# Patient Record
Sex: Male | Born: 1970 | Race: White | Hispanic: No | Smoking: Never smoker
Health system: Southern US, Community
[De-identification: ages and names within clinical notes are randomized; demographics above are authoritative.]

## PROBLEM LIST (undated history)

## (undated) DIAGNOSIS — I10 Essential (primary) hypertension: Secondary | ICD-10-CM

## (undated) DIAGNOSIS — L719 Rosacea, unspecified: Secondary | ICD-10-CM

## (undated) DIAGNOSIS — K219 Gastro-esophageal reflux disease without esophagitis: Secondary | ICD-10-CM

## (undated) DIAGNOSIS — G4730 Sleep apnea, unspecified: Secondary | ICD-10-CM

## (undated) HISTORY — PX: ADENOIDECTOMY: SUR15

## (undated) HISTORY — DX: Essential (primary) hypertension: I10

---

## 2009-05-09 ENCOUNTER — Ambulatory Visit: Payer: Self-pay | Admitting: Family Medicine

## 2013-03-21 DIAGNOSIS — R1013 Epigastric pain: Secondary | ICD-10-CM | POA: Insufficient documentation

## 2013-09-19 DIAGNOSIS — K2270 Barrett's esophagus without dysplasia: Secondary | ICD-10-CM | POA: Insufficient documentation

## 2014-09-23 ENCOUNTER — Ambulatory Visit: Payer: Self-pay | Admitting: Gastroenterology

## 2014-10-13 ENCOUNTER — Ambulatory Visit: Payer: Self-pay | Admitting: Gastroenterology

## 2015-04-10 ENCOUNTER — Ambulatory Visit
Admission: EM | Admit: 2015-04-10 | Discharge: 2015-04-10 | Disposition: A | Discharge status: Home / Self Care | Payer: BLUE CROSS/BLUE SHIELD | Attending: Internal Medicine | Admitting: Internal Medicine

## 2015-04-10 ENCOUNTER — Other Ambulatory Visit: Payer: Self-pay

## 2015-04-10 ENCOUNTER — Encounter: Payer: Self-pay | Admitting: Emergency Medicine

## 2015-04-10 DIAGNOSIS — R5383 Other fatigue: Secondary | ICD-10-CM | POA: Diagnosis present

## 2015-04-10 DIAGNOSIS — K219 Gastro-esophageal reflux disease without esophagitis: Secondary | ICD-10-CM | POA: Insufficient documentation

## 2015-04-10 DIAGNOSIS — R0789 Other chest pain: Secondary | ICD-10-CM

## 2015-04-10 DIAGNOSIS — K2270 Barrett's esophagus without dysplasia: Secondary | ICD-10-CM | POA: Insufficient documentation

## 2015-04-10 DIAGNOSIS — Z79899 Other long term (current) drug therapy: Secondary | ICD-10-CM | POA: Insufficient documentation

## 2015-04-10 DIAGNOSIS — L719 Rosacea, unspecified: Secondary | ICD-10-CM | POA: Insufficient documentation

## 2015-04-10 DIAGNOSIS — R531 Weakness: Secondary | ICD-10-CM | POA: Diagnosis not present

## 2015-04-10 HISTORY — DX: Rosacea, unspecified: L71.9

## 2015-04-10 HISTORY — DX: Gastro-esophageal reflux disease without esophagitis: K21.9

## 2015-04-10 MED ORDER — ASPIRIN 81 MG PO CHEW
324.0000 mg | CHEWABLE_TABLET | Freq: Once | ORAL | Status: AC
  Administered 2015-04-10: 324 mg via ORAL

## 2015-04-10 MED ORDER — NITROGLYCERIN 2 % TD OINT
0.5000 [in_us] | TOPICAL_OINTMENT | Freq: Once | TRANSDERMAL | Status: AC
  Administered 2015-04-10: 0.5 [in_us] via TOPICAL

## 2015-04-10 MED ORDER — GI COCKTAIL ~~LOC~~
30.0000 mL | Freq: Once | ORAL | Status: AC
  Administered 2015-04-10: 30 mL via ORAL

## 2015-04-10 NOTE — ED Notes (Signed)
Patient has no complaints of shortness of breath, dizziness.  Does report tiredness, chest congestion, some coughing. Patient thinks he may be having seasonal allergies.

## 2015-04-10 NOTE — ED Provider Notes (Signed)
CSN: [id number]     Arrival date & time 04/10/15  1457 History   First MD Initiated Contact with Patient 04/10/15 1607     Chief Complaint  Patient presents with  . Fatigue    patient complaining of tiredness and some chest congestion  . Gastrophageal Reflux    patient has history of chronic heartburn.   HPI  Patient is a 44 year old gentleman with history of Barrett's esophagus, on chronic omeprazole. He is due for a surveillance upper endoscopy in the near future. He presents today with a couple weeks history of fatigue, tired feeling, and some chest tightness. These are fairly constant symptoms, and are not worse with exertion. Patient reports that a friend of his had some sort of cardiac event in the last week, though he is doing fine, this scared him. He had an episode of marked awareness of his heart beat this afternoon, and he decided to be evaluated. No nausea, no sweating. Twice today, he had to walk a long way from his car to a building, without worsening of his symptoms. He is not short of breath. He relates that he works in Engineer, technical sales at [employer], and puts in 55-60 hours per week. Despite this, he said that his stress level is no different than usual, no new stress at home, or at work. He is very nonspecific about potential sources of stress. He does not exercise daily. He is not diabetic, has no history of hypertension, no history of hyperlipidemia, has never smoked. He has no family history of coronary disease in first-degree relatives. His father is 71 and has reflux issues. Paternal grandfather did have some heart disease with onset age 38s. He relates that he had a treadmill stress test, for similar symptoms, with [facility] cardiology in [facility] in the last year or 2 and was able to go 15-20 minutes on the treadmill. No subsequent testing was required.   Past Medical History  Diagnosis Date  . GERD (gastroesophageal reflux disease); [facility] says dx Barrett's esophagus   .  Rosacea   Patient reports treadmill stress test at [facility] in [facility] in the last year or two, was able to go 15-20 minutes on the treadmill.  Had stress test for symptoms similar to today's.  Past Surgical History  Procedure Laterality Date  . Tonsillectomy and adenoidectomy     Family History  Problem Relation Age of Onset  . Hypertension Mother   . GER disease Father  Currently age mid-65s   Paternal grandfather with heart disease, onset age 48s   Social History  Substance Use Topics  . Smoking status: Never Smoker   . Smokeless tobacco: Never Used  . Alcohol Use: Yes     Comment: social    Review of Systems  All other systems reviewed and are negative.   Allergies  Review of patient's allergies indicates no known allergies.  Home Medications   Prior to Admission medications   Medication Sig Start Date End Date Taking? Authorizing Provider  dexlansoprazole (DEXILANT) 60 MG capsule Take 60 mg by mouth daily.   Yes Historical Provider, MD  doxycycline (VIBRA-TABS) 100 MG tablet Take 100 mg by mouth as needed.    Historical Provider, MD   Meds Ordered and Administered this Visit   Medications  aspirin chewable tablet 324 mg (324 mg Oral Given 04/10/15 1556)  gi cocktail (Maalox,Lidocaine,Donnatal) (30 mLs Oral Given 04/10/15 1625)  nitroGLYCERIN (NITROGLYN) 2 % ointment 0.5 inch (0.5 inches Topical Given 04/10/15  1630)    BP 150/78 mmHg  Pulse 80  Temp(Src) 98.2 F (36.8 C) (Tympanic)  Ht 6\' 1"  (1.854 m)  Wt 185 lb (83.915 kg)  BMI 24.41 kg/m2  SpO2 100% Orthostatic VS for the past 24 hrs:  BP- Lying Pulse- Lying BP- Sitting Pulse- Sitting BP- Standing at 0 minutes Pulse- Standing at 0 minutes  04/10/15 1705 150/78 mmHg 80 157/85 mmHg 80 152/86 mmHg 82    Physical Exam  Constitutional: He is oriented to person, place, and time. No distress.  Alert, nicely groomed  HENT:  Head: Atraumatic.  Eyes:  Conjugate gaze, no eye redness/drainage   Neck: Neck supple.  Cardiovascular: Normal rate and regular rhythm.  Exam reveals no gallop.   No murmur heard. Pulmonary/Chest: No respiratory distress. He has no wheezes. He has no rales.  Lungs clear, symmetric breath sounds  Abdominal: Soft. He exhibits no distension. There is no tenderness. There is no guarding.  Musculoskeletal: Normal range of motion.  No leg swelling  Neurological: He is alert and oriented to person, place, and time.  Skin: Skin is warm and dry. No rash noted. No pallor.  Pink, no cyanosis  Nursing note and vitals reviewed.   ED Course  Procedures   ECG x 2 performed in the urgent care, 1 hour apart, I did not appreciate significant change between the two.  ECG 1 @14 :52:18: Sinus rhythm, no acute ST or T wave changes, nonspecific ST changes seen diffusely, no prior ECG to compare to.  QRS 104 msec; QTc 435 msec; R axis 28.  ECG 2 @15 :53:59:  Sinus rhythm, no acute ST or T wave changes, nonspecific ST changes redemonstrated diffusely.  QRS 104 msec; QTc 438 msec; R axis 28.    Pt given 4 baby asa po; 0.5" NTP applied and GI cocktail given. Symptoms of chest tightness improved from "3" to "2" during urgent care stay, and sbp improved from 170-180s at presentation, to 150s, prior to discharge. MDM   1. Atypical chest pain    Extremely minimal cardiac risk factors and non-exertional chest symptoms, seems unlikely to be cardiac.  Start baby aspirin daily and followup with [facility] early next week to discuss further evaluation/management of symptoms. To ER for severe/unrelenting symptoms.    Sherlene Shams, MD 04/11/15 1336

## 2015-04-11 NOTE — Discharge Instructions (Signed)
Take a baby aspirin daily. Go to ED for severe/persistent symptoms, particularly if exertional. Followup with [facility] 04/14/15.

## 2015-05-04 ENCOUNTER — Ambulatory Visit: Admission: RE | Admit: 2015-05-04 | Payer: Self-pay | Source: Ambulatory Visit | Admitting: Gastroenterology

## 2015-05-04 ENCOUNTER — Encounter: Admission: RE | Payer: Self-pay | Source: Ambulatory Visit

## 2015-05-04 SURGERY — ESOPHAGOGASTRODUODENOSCOPY (EGD) WITH PROPOFOL
Anesthesia: General

## 2016-02-20 IMAGING — MR MR ABDOMEN WO/W CM
10 of 16 series · 25 of 48 positions shown · IV contrast (multihance)
Comparison: Renal ultrasound dated 09/23/2014

CLINICAL DATA: Evaluate renal lesion on ultrasound

EXAM:
MRI ABDOMEN WITHOUT AND WITH CONTRAST
TECHNIQUE: Multiplanar multisequence MR imaging of the abdomen was performed
both before and after the administration of intravenous contrast.
CONTRAST:  17 mL Multihance IV

[Series 2: T2 · coronal · 8.0mm · 1.64mm/px · 1 of 21 slices shown]
[im 1/21]
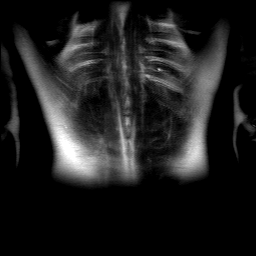

[Series 3: T2 fat-sat · axial · 8.0mm · 0.74mm/px · 1 of 25 slices shown]
[im 1/25]
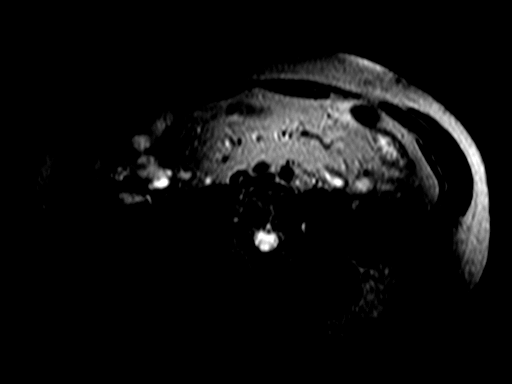

[Series 5: axial true fisp-- · axial · 8.0mm · 0.74mm/px · 1 of 25 slices shown]
[im 1/25]
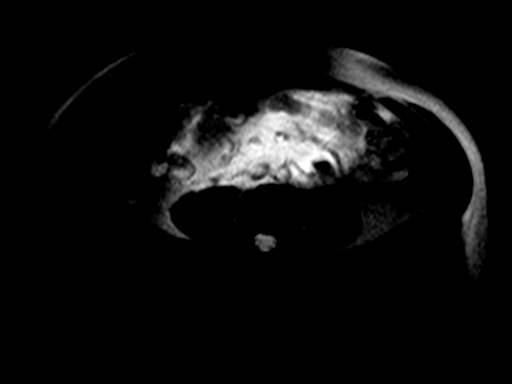

[Series 11: T2 post-contrast · axial · 8.0mm · 1.48mm/px · z∈[-157,+74]mm · 2 of 25 slices shown]
[im 1/25]
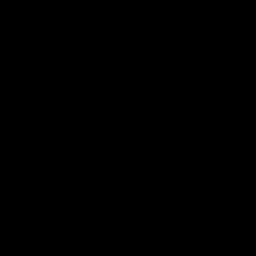
[im 25/25]
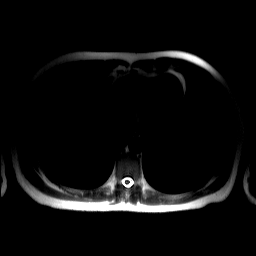

[Series 13: out of phase · axial · 8.0mm · 0.74mm/px · z∈[-157,+74]mm · 2 of 25 slices shown]
[im 1/25]
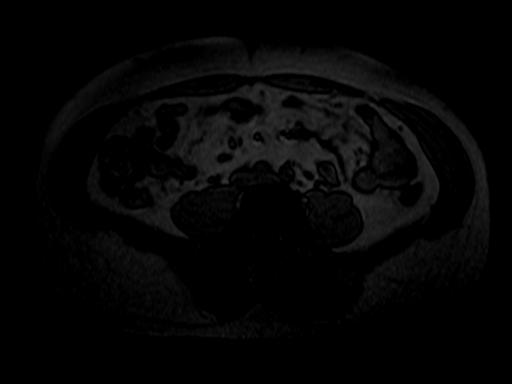
[im 25/25]
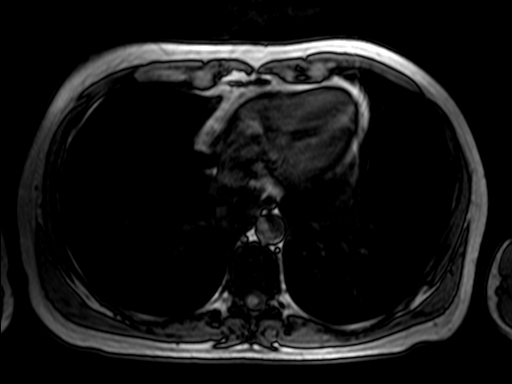

[Series 14: in phase · axial · 8.0mm · 0.74mm/px · z∈[-157,+74]mm · 2 of 25 slices shown]
[im 1/25]
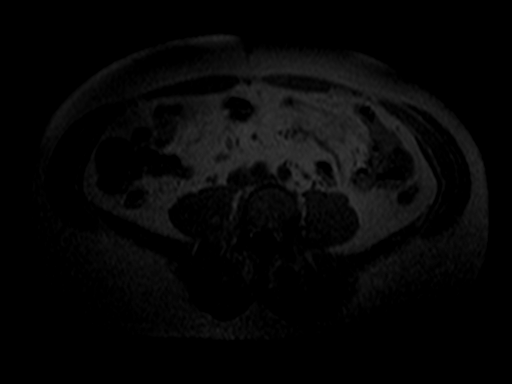
[im 25/25]
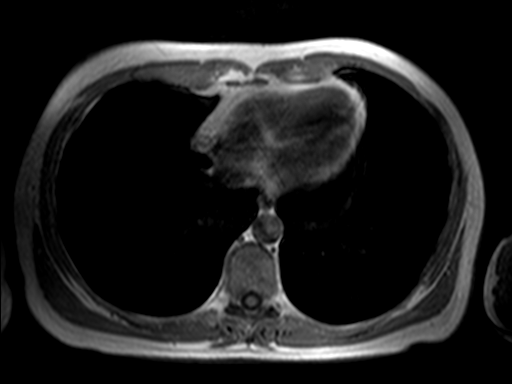

[Series 16: immed sub · axial · 4.0mm · 0.74mm/px · z∈[-159,+77]mm · 4 of 60 slices shown]
[im 1/60]
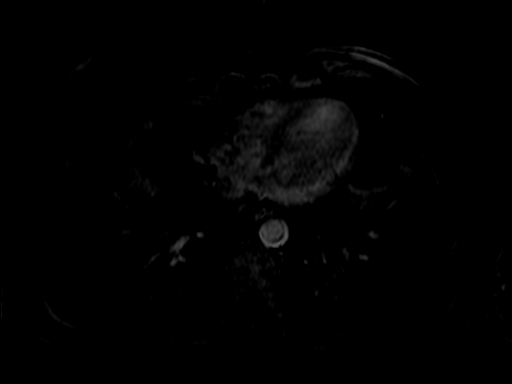
[im 20/60]
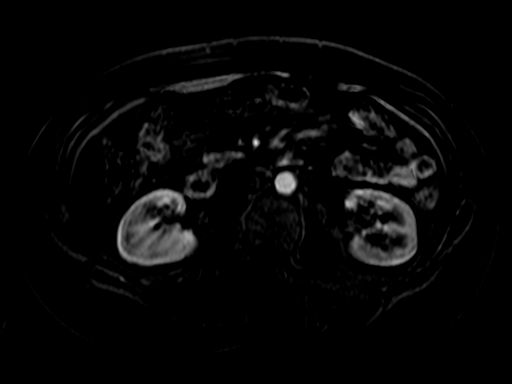
[im 40/60]
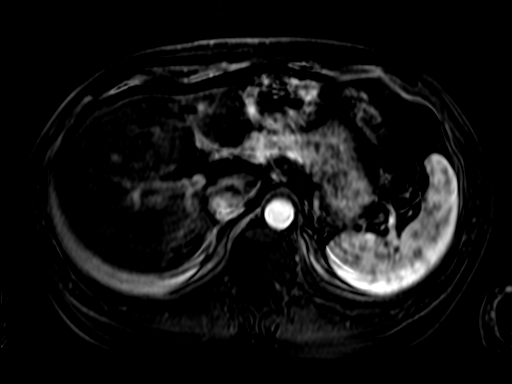
[im 60/60]
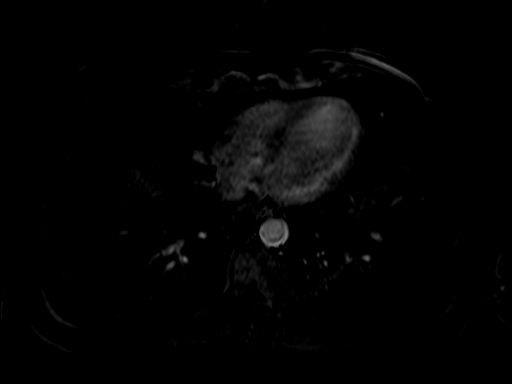

[Series 18: 45 sec sub · axial · 4.0mm · 0.74mm/px · z∈[-159,+77]mm · 4 of 60 slices shown]
[im 1/60]
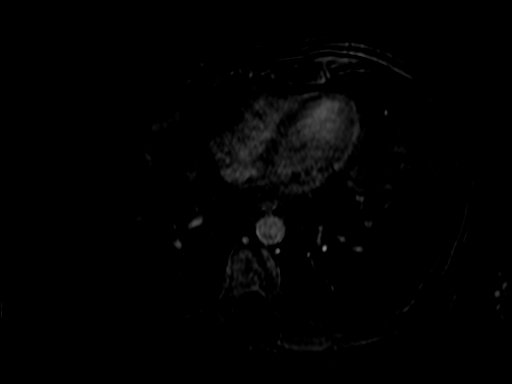
[im 20/60]
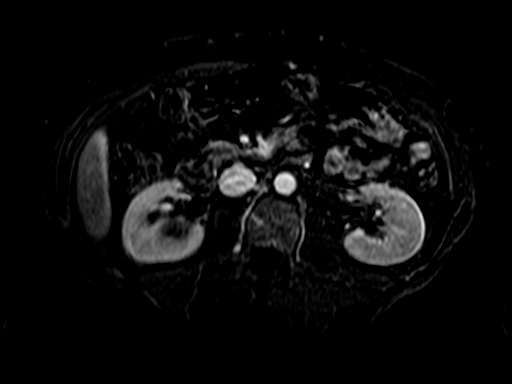
[im 40/60]
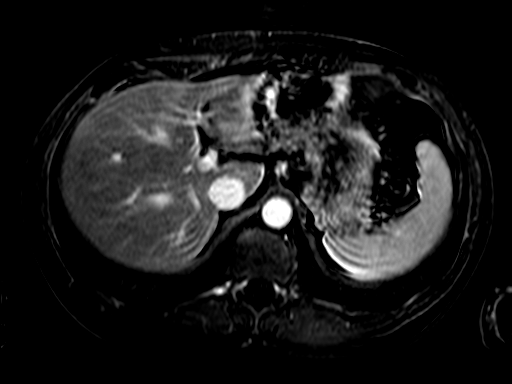
[im 60/60]
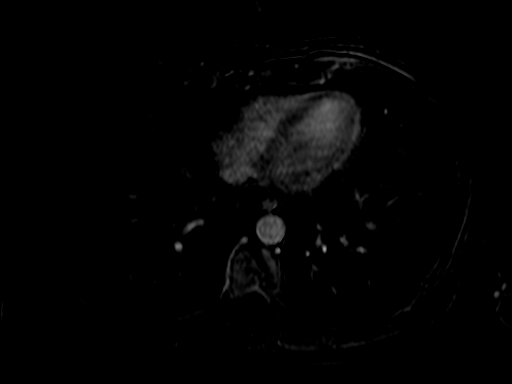

[Series 20: 90 sec sub · axial · 4.0mm · 0.74mm/px · z∈[-159,+77]mm · 4 of 60 slices shown]
[im 1/60]
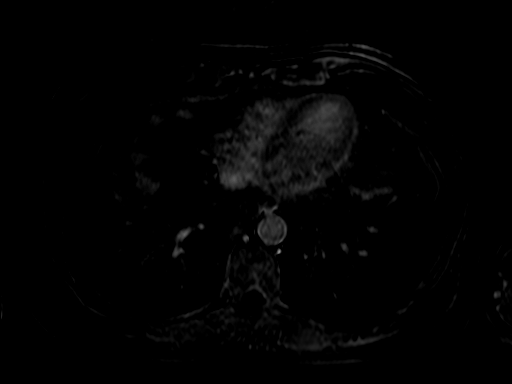
[im 20/60]
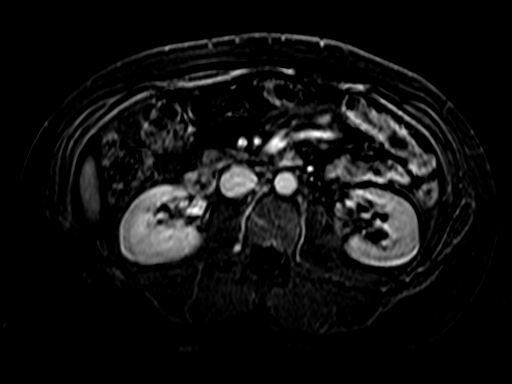
[im 40/60]
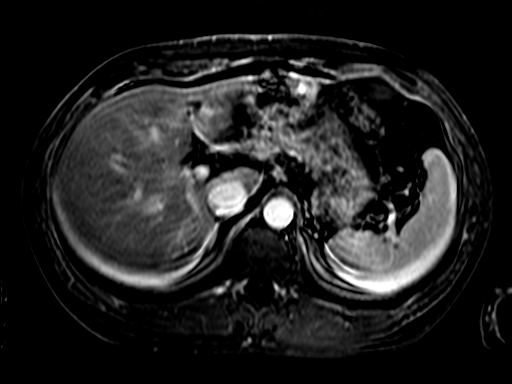
[im 60/60]
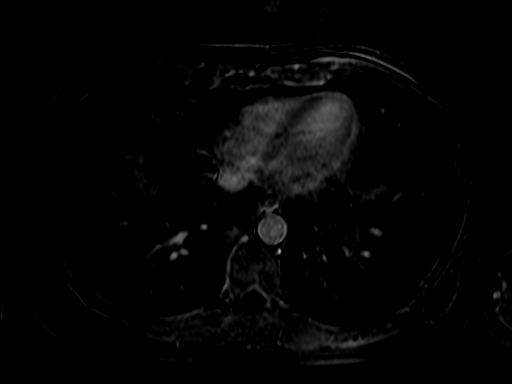

[Series 22: 3 min sub · axial · 4.0mm · 0.74mm/px · z∈[-159,+77]mm · 4 of 60 slices shown]
[im 1/60]
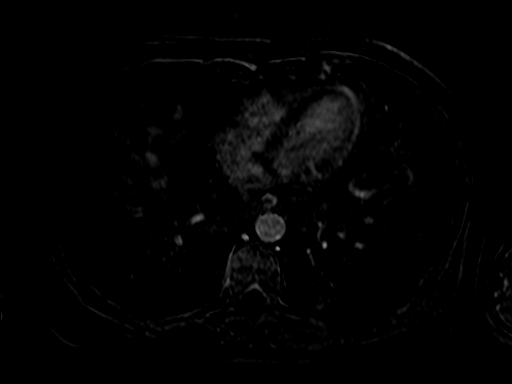
[im 20/60]
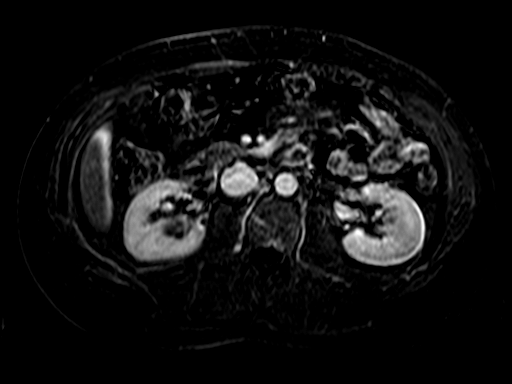
[im 40/60]
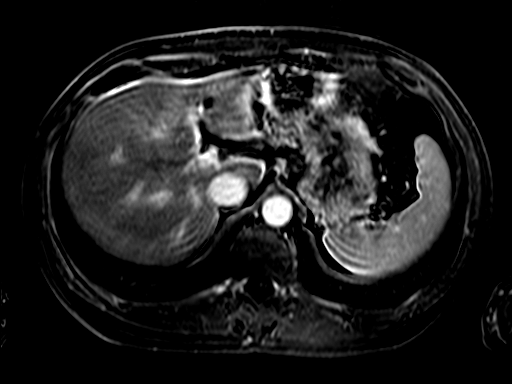
[im 60/60]
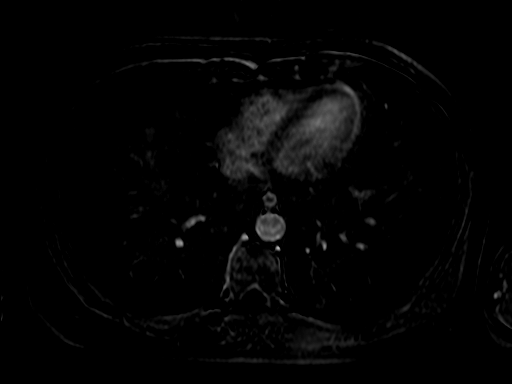

[25 of 48 positions shown; findings below may reference images not displayed]

FINDINGS: Lower chest:  Lung bases are clear.

Hepatobiliary: Liver is within normal limits. No
suspicious/enhancing hepatic lesions. No hepatic steatosis.

Gallbladder is unremarkable. No intrahepatic or extrahepatic ductal
dilatation.

Pancreas: Within normal limits.

Spleen: Within normal limits.

Adrenals/Urinary Tract: Adrenal glands are within normal limits.

3.8 x 3.3 x 3.1 cm right upper pole renal sinus cyst with mildly
irregular margins but no septations, solid components, or
enhancement following contrast administration, benign (Bosniak I).

Kidneys are otherwise within normal limits.  No hydronephrosis.

Stomach/Bowel: Stomach is visualized bowel are unremarkable.

Vascular/Lymphatic: No evidence of abdominal aortic aneurysm.

No suspicious abdominal lymphadenopathy.

Other: No abdominal ascites.

Musculoskeletal: No focal osseous lesions.
IMPRESSION: 3.8 cm right upper pole renal sinus cyst, benign (Bosniak I).

## 2017-02-23 ENCOUNTER — Encounter: Payer: Self-pay | Admitting: Adult Health

## 2017-02-23 ENCOUNTER — Ambulatory Visit: Payer: Self-pay | Admitting: Adult Health

## 2017-02-23 VITALS — BP 188/104 | HR 90 | Temp 99.6°F | Wt 172.8 lb

## 2017-02-23 DIAGNOSIS — G479 Sleep disorder, unspecified: Secondary | ICD-10-CM

## 2017-02-23 DIAGNOSIS — I10 Essential (primary) hypertension: Secondary | ICD-10-CM

## 2017-02-23 DIAGNOSIS — R000 Tachycardia, unspecified: Secondary | ICD-10-CM

## 2017-02-23 DIAGNOSIS — R0789 Other chest pain: Secondary | ICD-10-CM

## 2017-02-23 MED ORDER — METOPROLOL SUCCINATE ER 25 MG PO TB24: 25.0000 mg | ORAL_TABLET | Freq: Every day | ORAL | 0 refills | Status: DC

## 2017-02-23 NOTE — Patient Instructions (Addendum)
Hypertension Hypertension, commonly called high blood pressure, is when the force of blood pumping through the arteries is too strong. The arteries are the blood vessels that carry blood from the heart throughout the body. Hypertension forces the heart to work harder to pump blood and may cause arteries to become narrow or stiff. Having untreated or uncontrolled hypertension can cause heart attacks, strokes, kidney disease, and other problems. A blood pressure reading consists of a higher number over a lower number. Ideally, your blood pressure should be below 120/80. The first ("top") number is called the systolic pressure. It is a measure of the pressure in your arteries as your heart beats. The second ("bottom") number is called the diastolic pressure. It is a measure of the pressure in your arteries as the heart relaxes. What are the causes? The cause of this condition is not known. What increases the risk? Some risk factors for high blood pressure are under your control. Others are not. Factors you can change  Smoking.  Having type 2 diabetes mellitus, high cholesterol, or both.  Not getting enough exercise or physical activity.  Being overweight.  Having too much fat, sugar, calories, or salt (sodium) in your diet.  Drinking too much alcohol. Factors that are difficult or impossible to change  Having chronic kidney disease.  Having a family history of high blood pressure.  Age. Risk increases with age.  Race. You may be at higher risk if you are African-American.  Gender. Men are at higher risk than women before age 45. After age 65, women are at higher risk than men.  Having obstructive sleep apnea.  Stress. What are the signs or symptoms? Extremely high blood pressure (hypertensive crisis) may cause:  Headache.  Anxiety.  Shortness of breath.  Nosebleed.  Nausea and vomiting.  Severe chest pain.  Jerky movements you cannot control (seizures).  How is this  diagnosed? This condition is diagnosed by measuring your blood pressure while you are seated, with your arm resting on a surface. The cuff of the blood pressure monitor will be placed directly against the skin of your upper arm at the level of your heart. It should be measured at least twice using the same arm. Certain conditions can cause a difference in blood pressure between your right and left arms. Certain factors can cause blood pressure readings to be lower or higher than normal (elevated) for a short period of time:  When your blood pressure is higher when you are in a health care provider's office than when you are at home, this is called white coat hypertension. Most people with this condition do not need medicines.  When your blood pressure is higher at home than when you are in a health care provider's office, this is called masked hypertension. Most people with this condition may need medicines to control blood pressure.  If you have a high blood pressure reading during one visit or you have normal blood pressure with other risk factors:  You may be asked to return on a different day to have your blood pressure checked again.  You may be asked to monitor your blood pressure at home for 1 week or longer.  If you are diagnosed with hypertension, you may have other blood or imaging tests to help your health care provider understand your overall risk for other conditions. How is this treated? This condition is treated by making healthy lifestyle changes, such as eating healthy foods, exercising more, and reducing your alcohol intake. Your   health care provider may prescribe medicine if lifestyle changes are not enough to get your blood pressure under control, and if:  Your systolic blood pressure is above 130.  Your diastolic blood pressure is above 80.  Your personal target blood pressure may vary depending on your medical conditions, your age, and other factors. Follow these  instructions at home: Eating and drinking  Eat a diet that is high in fiber and potassium, and low in sodium, added sugar, and fat. An example eating plan is called the DASH (Dietary Approaches to Stop Hypertension) diet. To eat this way: ? Eat plenty of fresh fruits and vegetables. Try to fill half of your plate at each meal with fruits and vegetables. ? Eat whole grains, such as whole wheat pasta, brown rice, or whole grain bread. Fill about one quarter of your plate with whole grains. ? Eat or drink low-fat dairy products, such as skim milk or low-fat yogurt. ? Avoid fatty cuts of meat, processed or cured meats, and poultry with skin. Fill about one quarter of your plate with lean proteins, such as fish, chicken without skin, beans, eggs, and tofu. ? Avoid premade and processed foods. These tend to be higher in sodium, added sugar, and fat.  Reduce your daily sodium intake. Most people with hypertension should eat less than 1,500 mg of sodium a day.  Limit alcohol intake to no more than 1 drink a day for nonpregnant women and 2 drinks a day for men. One drink equals 12 oz of beer, 5 oz of wine, or 1 oz of hard liquor. Lifestyle  Work with your health care provider to maintain a healthy body weight or to lose weight. Ask what an ideal weight is for you.  Get at least 30 minutes of exercise that causes your heart to beat faster (aerobic exercise) most days of the week. Activities may include walking, swimming, or biking.  Include exercise to strengthen your muscles (resistance exercise), such as pilates or lifting weights, as part of your weekly exercise routine. Try to do these types of exercises for 30 minutes at least 3 days a week.  Do not use any products that contain nicotine or tobacco, such as cigarettes and e-cigarettes. If you need help quitting, ask your health care provider.  Monitor your blood pressure at home as told by your health care provider.  Keep all follow-up visits as  told by your health care provider. This is important. Medicines  Take over-the-counter and prescription medicines only as told by your health care provider. Follow directions carefully. Blood pressure medicines must be taken as prescribed.  Do not skip doses of blood pressure medicine. Doing this puts you at risk for problems and can make the medicine less effective.  Ask your health care provider about side effects or reactions to medicines that you should watch for. Contact a health care provider if:  You think you are having a reaction to a medicine you are taking.  You have headaches that keep coming back (recurring).  You feel dizzy.  You have swelling in your ankles.  You have trouble with your vision. Get help right away if:  You develop a severe headache or confusion.  You have unusual weakness or numbness.  You feel faint.  You have severe pain in your chest or abdomen.  You vomit repeatedly.  You have trouble breathing. Summary  Hypertension is when the force of blood pumping through your arteries is too strong. If this condition is not   controlled, it may put you at risk for serious complications.  Your personal target blood pressure may vary depending on your medical conditions, your age, and other factors. For most people, a normal blood pressure is less than 120/80.  Hypertension is treated with lifestyle changes, medicines, or a combination of both. Lifestyle changes include weight loss, eating a healthy, low-sodium diet, exercising more, and limiting alcohol. This information is not intended to replace advice given to you by your health care provider. Make sure you discuss any questions you have with your health care provider. Document Released: 07/25/2005 Document Revised: 06/22/2016 Document Reviewed: 06/22/2016 Elsevier Interactive Patient Education  2018 [employer] Angina Pectoris Angina pectoris is a very bad feeling in the chest, neck, or arm. Your  doctor may call it angina. There are four types of angina. Angina is caused by a lack of blood in the middle and thickest layer of the heart wall (myocardium). Angina may feel like a crushing or squeezing pain in the chest. It may feel like tightness or heavy pressure in the chest. Some people say it feels like gas, heartburn, or indigestion. Some people have symptoms other than pain. These include:  Shortness of breath.  Cold sweats.  Feeling sick to your stomach (nausea).  Feeling light-headed.  Many women have chest discomfort and some of the other symptoms. However, women often have different symptoms, such as:  Feeling tired (fatigue).  Feeling nervous for no reason.  Feeling weak for no reason.  Dizziness or fainting.  Women may have angina without any symptoms. Follow these instructions at home:  Take medicines only as told by your doctor.  Take care of other health issues as told by your doctor. These include: ? High blood pressure (hypertension). ? Diabetes.  Follow a heart-healthy diet. Your doctor can help you to choose healthy food options and make changes.  Talk to your doctor to learn more about healthy cooking methods and use them. These include: ? Roasting. ? Grilling. ? Broiling. ? Baking. ? Poaching. ? Steaming. ? Stir-frying.  Follow an exercise program approved by your doctor.  Keep a healthy weight. Lose weight as told by your doctor.  Rest when you are tired.  Learn to manage stress.  Do not use any tobacco, such as cigarettes, chewing tobacco, or electronic cigarettes. If you need help quitting, ask your doctor.  If you drink alcohol, and your doctor says it is okay, limit yourself to no more than 1 drink per day. One drink equals 12 ounces of beer, 5 ounces of wine, or 1 ounces of hard liquor.  Stop illegal drug use.  Keep all follow-up visits as told by your doctor. This is important. Do not take these medicines unless your doctor says  that you can:  Nonsteroidal anti-inflammatory drugs (NSAIDs). These include: ? Ibuprofen. ? Naproxen. ? Celecoxib.  Vitamin supplements that have vitamin A, vitamin E, or both.  Hormone therapy that contains estrogen with or without progestin.  Get help right away if:  You have pain in your chest, neck, arm, jaw, stomach, or back that: ? Lasts more than a few minutes. ? Comes back. ? Does not get better after you take medicine under your tongue (sublingual nitroglycerin).  You have any of these symptoms for no reason: ? Gas, heartburn, or indigestion. ? Sweating a lot. ? Shortness of breath or trouble breathing. ? Feeling sick to your stomach or throwing up. ? Feeling more tired than usual. ? Feeling nervous or worrying more   than usual. ? Feeling weak. ? Diarrhea.  You are suddenly dizzy or light-headed.  You faint or pass out. These symptoms may be an emergency. Do not wait to see if the symptoms will go away. Get medical help right away. Call your local emergency services (911 in the [place] Do not drive yourself to the hospital. This information is not intended to replace advice given to you by your health care provider. Make sure you discuss any questions you have with your health care provider. Document Released: 01/11/2008 Document Revised: 12/31/2015 Document Reviewed: 11/26/2013 Elsevier Interactive Patient Education  2017 [employer] After being diagnosed with an anxiety disorder, you may be relieved to know why you have felt or behaved a certain way. It is natural to also feel overwhelmed about the treatment ahead and what it will mean for your life. With care and support, you can manage this condition and recover from it. How to cope with anxiety Dealing with stress Stress is your body's reaction to life changes and events, both good and bad. Stress can last just a few hours or it can be ongoing. Stress can play a major role in anxiety, so it  is important to learn both how to cope with stress and how to think about it differently. Talk with your health care provider or a counselor to learn more about stress reduction. He or she may suggest some stress reduction techniques, such as:  Music therapy. This can include creating or listening to music that you enjoy and that inspires you.  Mindfulness-based meditation. This involves being aware of your normal breaths, rather than trying to control your breathing. It can be done while sitting or walking.  Centering prayer. This is a kind of meditation that involves focusing on a word, phrase, or sacred image that is meaningful to you and that brings you peace.  Deep breathing. To do this, expand your stomach and inhale slowly through your nose. Hold your breath for 3-5 seconds. Then exhale slowly, allowing your stomach muscles to relax.  Self-talk. This is a skill where you identify thought patterns that lead to anxiety reactions and correct those thoughts.  Muscle relaxation. This involves tensing muscles then relaxing them.  Choose a stress reduction technique that fits your lifestyle and personality. Stress reduction techniques take time and practice. Set aside 5-15 minutes a day to do them. Therapists can offer training in these techniques. The training may be covered by some insurance plans. Other things you can do to manage stress include:  Keeping a stress diary. This can help you learn what triggers your stress and ways to control your response.  Thinking about how you respond to certain situations. You may not be able to control everything, but you can control your reaction.  Making time for activities that help you relax, and not feeling guilty about spending your time in this way.  Therapy combined with coping and stress-reduction skills provides the best chance for successful treatment. Medicines Medicines can help ease symptoms. Medicines for anxiety include:  Anti-anxiety  drugs.  Antidepressants.  Beta-blockers.  Medicines may be used as the main treatment for anxiety disorder, along with therapy, or if other treatments are not working. Medicines should be prescribed by a health care provider. Relationships Relationships can play a big part in helping you recover. Try to spend more time connecting with trusted friends and family members. Consider going to couples counseling, taking family education classes, or going to family therapy.  Therapy can help you and others better understand the condition. How to recognize changes in your condition Everyone has a different response to treatment for anxiety. Recovery from anxiety happens when symptoms decrease and stop interfering with your daily activities at home or work. This may mean that you will start to:  Have better concentration and focus.  Sleep better.  Be less irritable.  Have more energy.  Have improved memory.  It is important to recognize when your condition is getting worse. Contact your health care provider if your symptoms interfere with home or work and you do not feel like your condition is improving. Where to find help and support: You can get help and support from these sources:  Self-help groups.  Online and [employer].  A trusted spiritual leader.  Couples counseling.  Family education classes.  Family therapy.  Follow these instructions at home:  Eat a healthy diet that includes plenty of vegetables, fruits, whole grains, low-fat dairy products, and lean protein. Do not eat a lot of foods that are high in solid fats, added sugars, or salt.  Exercise. Most adults should do the following: ? Exercise for at least 150 minutes each week. The exercise should increase your heart rate and make you sweat (moderate-intensity exercise). ? Strengthening exercises at least twice a week.  Cut down on caffeine, tobacco, alcohol, and other potentially harmful  substances.  Get the right amount and quality of sleep. Most adults need 7-9 hours of sleep each night.  Make choices that simplify your life.  Take over-the-counter and prescription medicines only as told by your health care provider.  Avoid caffeine, alcohol, and certain over-the-counter cold medicines. These may make you feel worse. Ask your pharmacist which medicines to avoid.  Keep all follow-up visits as told by your health care provider. This is important. Questions to ask your health care provider  Would I benefit from therapy?  How often should I follow up with a health care provider?  How long do I need to take medicine?  Are there any long-term side effects of my medicine?  Are there any alternatives to taking medicine? Contact a health care provider if:  You have a hard time staying focused or finishing daily tasks.  You spend many hours a day feeling worried about everyday life.  You become exhausted by worry.  You start to have headaches, feel tense, or have nausea.  You urinate more than normal.  You have diarrhea. Get help right away if:  You have a racing heart and shortness of breath.  You have thoughts of hurting yourself or others. If you ever feel like you may hurt yourself or others, or have thoughts about taking your own life, get help right away. You can go to your nearest emergency department or call:  Your local emergency services (911 in the [place]  A suicide crisis helpline, such as the [place] at 260 340 2019. This is open 24-hours a day.  Summary  Taking steps to deal with stress can help calm you.  Medicines cannot cure anxiety disorders, but they can help ease symptoms.  Family, friends, and partners can play a big part in helping you recover from an anxiety disorder. This information is not intended to replace advice given to you by your health care provider. Make sure you discuss any questions you  have with your health care provider. Document Released: 07/19/2016 Document Revised: 07/19/2016 Document Reviewed: 07/19/2016 Elsevier Interactive Patient Education  [employer].  Metoprolol extended-release tablets What is this medicine? METOPROLOL (me TOE proe lole) is a beta-blocker. Beta-blockers reduce the workload on the heart and help it to beat more regularly. This medicine is used to treat high blood pressure and to prevent chest pain. It is also used to after a heart attack and to prevent an additional heart attack from occurring. This medicine may be used for other purposes; ask your health care provider or pharmacist if you have questions. COMMON BRAND NAME(S): toprol, Toprol XL What should I tell my health care provider before I take this medicine? They need to know if you have any of these conditions: -diabetes -heart or vessel disease like slow heart rate, worsening heart failure, heart block, sick sinus syndrome or Raynaud's disease -kidney disease -liver disease -lung or breathing disease, like asthma or emphysema -pheochromocytoma -thyroid disease -an unusual or allergic reaction to metoprolol, other beta-blockers, medicines, foods, dyes, or preservatives -pregnant or trying to get pregnant -breast-feeding How should I use this medicine? Take this medicine by mouth with a glass of water. Follow the directions on the prescription label. Do not crush or chew. Take this medicine with or immediately after meals. Take your doses at regular intervals. Do not take more medicine than directed. Do not stop taking this medicine suddenly. This could lead to serious heart-related effects. Talk to your pediatrician regarding the use of this medicine in children. While this drug may be prescribed for children as young as 6 years for selected conditions, precautions do apply. Overdosage: If you think you have taken too much of this medicine contact a poison control center or emergency  room at once. NOTE: This medicine is only for you. Do not share this medicine with others. What if I miss a dose? If you miss a dose, take it as soon as you can. If it is almost time for your next dose, take only that dose. Do not take double or extra doses. What may interact with this medicine? This medicine may interact with the following medications: -certain medicines for blood pressure, heart disease, irregular heart beat -certain medicines for depression, like monoamine oxidase (MAO) inhibitors, fluoxetine, or paroxetine -clonidine -dobutamine -epinephrine -isoproterenol -reserpine This list may not describe all possible interactions. Give your health care provider a list of all the medicines, herbs, non-prescription drugs, or dietary supplements you use. Also tell them if you smoke, drink alcohol, or use illegal drugs. Some items may interact with your medicine. What should I watch for while using this medicine? Visit your doctor or health care professional for regular check ups. Contact your doctor right away if your symptoms worsen. Check your blood pressure and pulse rate regularly. Ask your health care professional what your blood pressure and pulse rate should be, and when you should contact them. You may get drowsy or dizzy. Do not drive, use machinery, or do anything that needs mental alertness until you know how this medicine affects you. Do not sit or stand up quickly, especially if you are an older patient. This reduces the risk of dizzy or fainting spells. Contact your doctor if these symptoms continue. Alcohol may interfere with the effect of this medicine. Avoid alcoholic drinks. What side effects may I notice from receiving this medicine? Side effects that you should report to your doctor or health care professional as soon as possible: -allergic reactions like skin rash, itching or hives -cold or numb hands or feet -depression -difficulty breathing -faint -fever with  sore throat -irregular heartbeat, chest  pain -rapid weight gain -swollen legs or ankles Side effects that usually do not require medical attention (report to your doctor or health care professional if they continue or are bothersome): -anxiety or nervousness -change in sex drive or performance -dry skin -headache -nightmares or trouble sleeping -short term memory loss -stomach upset or diarrhea -unusually tired This list may not describe all possible side effects. Call your doctor for medical advice about side effects. You may report side effects to FDA at 1-800-FDA-1088. Where should I keep my medicine? Keep out of the reach of children. Store at room temperature between 15 and 30 degrees C (59 and 86 degrees F). Throw away any unused medicine after the expiration date. NOTE: This sheet is a summary. It may not cover all possible information. If you have questions about this medicine, talk to your doctor, pharmacist, or health care provider.  2018 Elsevier/Gold Standard (2013-03-29 14:41:37)  Stress and Stress Management Stress is a normal reaction to life events. It is what you feel when life demands more than you are used to or more than you can handle. Some stress can be useful. For example, the stress reaction can help you catch the last bus of the day, study for a test, or meet a deadline at work. But stress that occurs too often or for too long can cause problems. It can affect your emotional health and interfere with relationships and normal daily activities. Too much stress can weaken your immune system and increase your risk for physical illness. If you already have a medical problem, stress can make it worse. What are the causes? All sorts of life events may cause stress. An event that causes stress for one person may not be stressful for another person. Major life events commonly cause stress. These may be positive or negative. Examples include losing your job, moving into a new  home, getting married, having a baby, or losing a loved one. Less obvious life events may also cause stress, especially if they occur day after day or in combination. Examples include working long hours, driving in traffic, caring for children, being in debt, or being in a difficult relationship. What are the signs or symptoms? Stress may cause emotional symptoms including, the following:  Anxiety. This is feeling worried, afraid, on edge, overwhelmed, or out of control.  Anger. This is feeling irritated or impatient.  Depression. This is feeling sad, down, helpless, or guilty.  Difficulty focusing, remembering, or making decisions.  Stress may cause physical symptoms, including the following:  Aches and pains. These may affect your head, neck, back, stomach, or other areas of your body.  Tight muscles or clenched jaw.  Low energy or trouble sleeping.  Stress may cause unhealthy behaviors, including the following:  Eating to feel better (overeating) or skipping meals.  Sleeping too little, too much, or both.  Working too much or putting off tasks (procrastination).  Smoking, drinking alcohol, or using drugs to feel better.  How is this diagnosed? Stress is diagnosed through an assessment by your health care provider. Your health care provider will ask questions about your symptoms and any stressful life events.Your health care provider will also ask about your medical history and may order blood tests or other tests. Certain medical conditions and medicine can cause physical symptoms similar to stress. Mental illness can cause emotional symptoms and unhealthy behaviors similar to stress. Your health care provider may refer you to a mental health professional for further evaluation. How is this treated?   Stress management is the recommended treatment for stress.The goals of stress management are reducing stressful life events and coping with stress in healthy ways. Techniques for  reducing stressful life events include the following:  Stress identification. Self-monitor for stress and identify what causes stress for you. These skills may help you to avoid some stressful events.  Time management. Set your priorities, keep a calendar of events, and learn to say "no." These tools can help you avoid making too many commitments.  Techniques for coping with stress include the following:  Rethinking the problem. Try to think realistically about stressful events rather than ignoring them or overreacting. Try to find the positives in a stressful situation rather than focusing on the negatives.  Exercise. Physical exercise can release both physical and emotional tension. The key is to find a form of exercise you enjoy and do it regularly.  Relaxation techniques. These relax the body and mind. Examples include yoga, meditation, tai chi, biofeedback, deep breathing, progressive muscle relaxation, listening to music, being out in nature, journaling, and other hobbies. Again, the key is to find one or more that you enjoy and can do regularly.  Healthy lifestyle. Eat a balanced diet, get plenty of sleep, and do not smoke. Avoid using alcohol or drugs to relax.  Strong support network. Spend time with family, friends, or other people you enjoy being around.Express your feelings and talk things over with someone you trust.  Counseling or talktherapy with a mental health professional may be helpful if you are having difficulty managing stress on your own. Medicine is typically not recommended for the treatment of stress.Talk to your health care provider if you think you need medicine for symptoms of stress. Follow these instructions at home:  Keep all follow-up visits as directed by your health care provider.  Take all medicines as directed by your health care provider. Contact a health care provider if:  Your symptoms get worse or you start having new symptoms.  You feel  overwhelmed by your problems and can no longer manage them on your own. Get help right away if:  You feel like hurting yourself or someone else. This information is not intended to replace advice given to you by your health care provider. Make sure you discuss any questions you have with your health care provider. Document Released: 01/18/2001 Document Revised: 12/31/2015 Document Reviewed: 03/19/2013 Elsevier Interactive Patient Education  2017 Elsevier Inc. Diphenhydramine capsules or tablets What is this medicine? DIPHENHYDRAMINE (dye fen HYE dra meen) is an antihistamine. It is used to treat the symptoms of an allergic reaction. It is also used to treat Parkinson's disease. This medicine is also used to prevent and to treat motion sickness and as a nighttime sleep aid. This medicine may be used for other purposes; ask your health care provider or pharmacist if you have questions. COMMON BRAND NAME(S): Alka-Seltzer Plus Allergy, Aller-G-Time, Banophen, Benadryl Allergy, Benadryl Allergy Dye Free, Benadryl Allergy Kapgel, Benadryl Allergy Ultratab, Diphedryl, Diphenhist, Genahist, PHARBEDRYL, Q-Dryl, Sleepinal, Valu-Dryl, Vicks ZzzQuil Nightime Sleep-Aid What should I tell my health care provider before I take this medicine? They need to know if you have any of these conditions: -asthma or lung disease -glaucoma -high blood pressure or heart disease -liver disease -pain or difficulty passing urine -prostate trouble -ulcers or other stomach problems -an unusual or allergic reaction to diphenhydramine, other medicines foods, dyes, or preservatives such as sulfites -pregnant or trying to get pregnant -breast-feeding How should I use this medicine? Take this medicine   by mouth with a full glass of water. Follow the directions on the prescription label. Take your doses at regular intervals. Do not take your medicine more often than directed. To prevent motion sickness start taking this medicine  30 to 60 minutes before you leave. Talk to your pediatrician regarding the use of this medicine in children. Special care may be needed. Patients over 65 years old may have a stronger reaction and need a smaller dose. Overdosage: If you think you have taken too much of this medicine contact a poison control center or emergency room at once. NOTE: This medicine is only for you. Do not share this medicine with others. What if I miss a dose? If you miss a dose, take it as soon as you can. If it is almost time for your next dose, take only that dose. Do not take double or extra doses. What may interact with this medicine? Do not take this medicine with any of the following medications: -MAOIs like Carbex, Eldepryl, Marplan, Nardil, and Parnate This medicine may also interact with the following medications: -alcohol -barbiturates, like phenobarbital -medicines for bladder spasm like oxybutynin, tolterodine -medicines for blood pressure -medicines for depression, anxiety, or psychotic disturbances -medicines for movement abnormalities or Parkinson's disease -medicines for sleep -other medicines for cold, cough or allergy -some medicines for the stomach like chlordiazepoxide, dicyclomine This list may not describe all possible interactions. Give your health care provider a list of all the medicines, herbs, non-prescription drugs, or dietary supplements you use. Also tell them if you smoke, drink alcohol, or use illegal drugs. Some items may interact with your medicine. What should I watch for while using this medicine? Visit your doctor or health care professional for regular check ups. Tell your doctor if your symptoms do not improve or if they get worse. Your mouth may get dry. Chewing sugarless gum or sucking hard candy, and drinking plenty of water may help. Contact your doctor if the problem does not go away or is severe. This medicine may cause dry eyes and blurred vision. If you wear contact  lenses you may feel some discomfort. Lubricating drops may help. See your eye doctor if the problem does not go away or is severe. You may get drowsy or dizzy. Do not drive, use machinery, or do anything that needs mental alertness until you know how this medicine affects you. Do not stand or sit up quickly, especially if you are an older patient. This reduces the risk of dizzy or fainting spells. Alcohol may interfere with the effect of this medicine. Avoid alcoholic drinks. What side effects may I notice from receiving this medicine? Side effects that you should report to your doctor or health care professional as soon as possible: -allergic reactions like skin rash, itching or hives, swelling of the face, lips, or tongue -changes in vision -confused, agitated, nervous -irregular or fast heartbeat -tremor -trouble passing urine -unusual bleeding or bruising -unusually weak or tired Side effects that usually do not require medical attention (report to your doctor or health care professional if they continue or are bothersome): -constipation, diarrhea -drowsy -headache -loss of appetite -stomach upset, vomiting -thick mucous This list may not describe all possible side effects. Call your doctor for medical advice about side effects. You may report side effects to FDA at 1-800-FDA-1088. Where should I keep my medicine? Keep out of the reach of children. Store at room temperature between 15 and 30 degrees C (59 and 86 degrees F). Keep container   closed tightly. Throw away any unused medicine after the expiration date. NOTE: This sheet is a summary. It may not cover all possible information. If you have questions about this medicine, talk to your doctor, pharmacist, or health care provider.  2018 Elsevier/Gold Standard (2007-11-12 17:06:22) s 

## 2017-02-23 NOTE — Progress Notes (Addendum)
Subjective:     Patient ID: Nicholas Willis, male   DOB: 1971/04/02, 46 y.o.   MRN: [id number]  HPI   Patient is a 46 year old male who presents to the clinic today for with reports that he has not been sleeping well and he has had elevated blood pressures. He is in no acute distress, and denies any shortness of breath, chest pain or tightness. He also denies any nausea,  vomiting or recent illness.   Patient reports that his job is stressful and he is also under stress as he is in the  process of separation with his  Wife and they have a 23 year old son together.   She reports decreased sleep and that he is able to fall asleep without difficulty and is awakeninng at night every four hours or so. Patient reports he has a lot on his mind with the divorce and all the changes occurring in his life.  Has tried Benadryl or  Tylenol PM at different times and this has improved sleep slightly.     He does have a history of Barret's Esophagus and gastrointestinal  reflux that is treated with dexlansoprazole (DEXILANT) 60 MG capsule and followed per patient. He reports he has had  increased acid taste in the morning upon wakening.     He reports chest tightness when awakening some mornings located in the center of  His chest and once he is  up and moving this tightness goes away per his report.  He does report that this occurs  every morning and he has attributed it to his reflux.   He also reports that he exercises every day and is able to do elliptical 30 to 40 minutes  every day and denies nay pain. He did exercise like this today without any symptoms or difficulty per his report.   He denies any shortness of breath,  chest pain, radiating pain to neck, jaw, back or arms currently.   He does reports that at times he feels as if his left lower arm into first three digits of left hand  has some  tingling and is more heavy than the other mostly  when he awakens at night from sleep, he has felt this a "  once or twice " during the day.  He denies any of these symptoms at present or today.He denies any edema or persistent cough.  He denies any injury.   He reports his father has a  sleep apnea history and patient would like to be tested as well. He reports  he  passed the sleep test one year ago. He would like to have this sleep study repeated in the near future if sleep does not improve. He denies any shortness of breath or apnea. He does reports increased agitation in the mornings and feels as if he never gets into a deep sleep and never feels rested. He requests sleep study with Dr. Heidi Dach as he has a friend who sees this provider.   Patient reports he  ran across the campus to staff office for today's visit without difficulty or any symptoms. He denies any exertional chest tightness or any other symptoms with exertion.    His primary care MD is Dr. Inis Sizer has had him monitoring blood pressure readings at home and he reports these have been elevated consistently.  His most recent visit with primary care was  2 months ago.He has an appoimtment for his physical with his primary care  August 3rd office visit.  He reports he has had abnormal EKG's in the past and had a stress test done over two years ago that was normal nd he reports no further testing was done. Provider is unable to view those records in [facility], patient reports he was seen in [place].  Patient reports he is " high strung and anxious " and that this has increased with the increased stressors mentioned above.  He would like to try medication to help with symptoms in the near future. He denies any suicidal/ homicidal ideations, he denies any flight of ideas or changes in behavior.   Past Surgical History:  Procedure Laterality Date  . TONSILLECTOMY AND ADENOIDECTOMY     Active Ambulatory Problems    Diagnosis Date Noted  . Barrett's esophagus 09/19/2013  . Epigastric pain 03/21/2013   Resolved Ambulatory Problems    Diagnosis  Date Noted  . No Resolved Ambulatory Problems   Past Medical History:  Diagnosis Date  . GERD (gastroesophageal reflux disease)   . Rosacea     Paternal grandfather with heart disease unspecified onset age 54.  Social History  Substance Use Topics  . Smoking status: Never Smoker  . Smokeless tobacco: Never Used  . Alcohol use Yes     Comment: social   Review of Systems  Constitutional: Positive for fatigue (he reports he is still working but does have increased tiredness " nothing that makes me stiop my usual routine" ). Negative for activity change, chills, diaphoresis, fever and unexpected weight change.  HENT: Negative.   Eyes: Negative.   Respiratory: Positive for cough (mild cough in am  with reflux ). Negative for apnea, choking, chest tightness (in the morning upon wakening per patient.), shortness of breath, wheezing and stridor.   Cardiovascular: Negative for chest pain, palpitations and leg swelling.  Gastrointestinal: Negative.  Negative for abdominal distention, abdominal pain, anal bleeding, blood in stool, constipation, diarrhea, nausea, rectal pain and vomiting.  Endocrine: Negative for cold intolerance, heat intolerance, polydipsia, polyphagia and polyuria.  Genitourinary: Negative.   Musculoskeletal: Negative.   Skin: Negative.   Allergic/Immunologic: Negative for environmental allergies, food allergies and immunocompromised state.  Neurological: Negative for dizziness, tremors, seizures, syncope, facial asymmetry, speech difficulty, weakness, light-headedness, numbness (left fingers numb when asleep and awakes intermittenly per his report, non today ) and headaches.  Hematological: Negative for adenopathy. Does not bruise/bleed easily.  Psychiatric/Behavioral: Negative for agitation, behavioral problems, confusion, decreased concentration, dysphoric mood, hallucinations, self-injury, sleep disturbance and suicidal ideas. The patient is not nervous/anxious and is not  hyperactive.        Objective:   Physical Exam  Constitutional: He is oriented to person, place, and time. He appears well-developed and well-nourished. He is active.  Non-toxic appearance. He does not have a sickly appearance. He does not appear ill. No distress.  Patient moves on and off of exam table without difficulty. He has normal gait with ambulation.   HENT:  Head: Normocephalic and atraumatic.  Right Ear: Hearing, tympanic membrane, external ear and ear canal normal.  Left Ear: Hearing, tympanic membrane, external ear and ear canal normal.  Nose: Nose normal.  Mouth/Throat: Uvula is midline, oropharynx is clear and moist and mucous membranes are normal.  Eyes: Pupils are equal, round, and reactive to light. Conjunctivae, EOM and lids are normal.  Neck: Trachea normal and normal range of motion. Neck supple. Normal carotid pulses and no JVD present. Carotid bruit is not present. No thyroid mass and no thyromegaly  present.  Cardiovascular: Normal rate, regular rhythm, S1 normal, S2 normal, normal heart sounds and intact distal pulses.  Exam reveals no gallop, no distant heart sounds and no friction rub.   No murmur heard. Pulmonary/Chest: Effort normal and breath sounds normal. No respiratory distress. He has no wheezes. He has no rales. He exhibits no tenderness.  Abdominal: Normal appearance.  Musculoskeletal: Normal range of motion.  Neurological: He is alert and oriented to person, place, and time. He has normal strength and normal reflexes.  Skin: Skin is warm and dry. No rash noted. He is not diaphoretic. No pallor.  Psychiatric: He has a normal mood and affect. His speech is normal and behavior is normal. Judgment and thought content normal. His mood appears not anxious (mild ). His affect is not angry, not blunt, not labile and not inappropriate. Cognition and memory are normal. He does not exhibit a depressed mood.       Assessment:    1. EKG in office complete and  discussed/ reviewed by Dr. Carmin Richmond. Sinus rhythm with no acute ST or T wave changes, non specific ST changes, similar to previous EKG's in epic. EKG faxed and reviewed. Sent  to Dr. Rosanna Randy for review signature and advised ok with plan as documented.Marland Kitchen EKG with nonspecific abnormal findings.    2.  Will check labs In office with TSH with panel, CBC with diff, BMET , Vitamin D ( Full panel requested by patient since upcoming physical August 3rd- will draw and give copy to patient at next visit)  Plan:     1. Discussed patient with Dr. Rosanna Randy supervising MD patient is stable without symptoms in office today, will refer to cardiology. Patient has seen cardiology in [place] two years ago but prefers  To be seen in [employer] area this time. Referral made to Cardiology in [facility]. Patient advised to call if he has not received a call from the office within 5 days.     2. Hypertension: Medication as prescribed below. Monitor heart rate before taking medication. Monitor blood pressure daily.  Meds ordered this encounter  Medications  . metoprolol succinate (TOPROL-XL) 25 MG 24 hr tablet    Sig: Take 1 tablet (25 mg total) by mouth daily.    Dispense:  30 tablet    Refill:  0  . aspirin chewable tablet 81 mg   Patient advised to monitor his blood pressure and heart rate and not to take Metoprolol if heart rate less than 60  Beats per minute. Discussed normal parameters for blood pressures and going to urgent care/ ED while out of town next week if any changes or new symptoms arise.   3. Will add Aspirin 81 mg daily- patient aware.   4. [facility] requests  sleep apnea study with Dr. Devona Konig and will revisit this after cardiology appointment as well as at next visit.   5. Atypical Chest pain: Discussed emergency cardiac symptoms and advised to call 911 for any chest pain, shortness of breath, nausea, vomiting or any new or unusual symptom. Patient verbalized understanding. Patient also advised to  be seen in the emergency room for any chest pain, shortness of breath, nausea, vomiting, persistent chest tightness or any radiating pain. Patient verbalized understanding.   6. Anxiety- Dr. Rosanna Randy and patient in agreement with possibility of starting anti-depressant medication for increased stress/ anxiety and will consider after cardiology appointment and dependent upon symptoms. Patient advised to seek medical care if any changes in behavior, suicidal or homicidal  ideations or any other changes. Patient verbalized understanding.   7.Provider requested patient have a follow up appointment early next week for physical assessment and vital sign recheck, patient declined as he will be out of town on vacation and his next available day for a visit is 02/27/17- appointment was scheduled.   6. Patient verbalized understanding all of the above.   02/24/17 Discussed with Dr. Rosanna Randy and advised ok to refer for sleep study with Dr. Humphrey Rolls ( physician per patients request) Order placed in [facility]. Dr. Devona Konig is with Internal Medicine and web site lists sleep medicine/pulmonary.

## 2017-02-24 ENCOUNTER — Telehealth: Payer: Self-pay | Admitting: Adult Health

## 2017-02-24 LAB — CMP12+LP+TP+TSH+6AC+CBC/D/PLT
A/G RATIO: 2 (ref 1.2–2.2)
ALBUMIN: 4.6 g/dL (ref 3.5–5.5)
ALT: 29 IU/L (ref 0–44)
AST: 28 IU/L (ref 0–40)
Alkaline Phosphatase: 43 IU/L (ref 39–117)
BASOS ABS: 0 10*3/uL (ref 0.0–0.2)
BUN/Creatinine Ratio: 14 (ref 9–20)
BUN: 14 mg/dL (ref 6–24)
Basos: 0 %
Bilirubin Total: 1.5 mg/dL — ABNORMAL HIGH (ref 0.0–1.2)
CALCIUM: 9.5 mg/dL (ref 8.7–10.2)
CHOL/HDL RATIO: 2.3 ratio (ref 0.0–5.0)
Chloride: 100 mmol/L (ref 96–106)
Cholesterol, Total: 159 mg/dL (ref 100–199)
Creatinine, Ser: 1.03 mg/dL (ref 0.76–1.27)
EOS (ABSOLUTE): 0 10*3/uL (ref 0.0–0.4)
Eos: 1 %
Estimated CHD Risk: <0.5 times avg. (ref 0.0–1.0)
Free Thyroxine Index: 1.6 (ref 1.2–4.9)
GFR calc Af Amer: 100 mL/min/{1.73_m2} (ref >59)
GFR, EST NON AFRICAN AMERICAN: 87 mL/min/{1.73_m2} (ref >59)
GGT: 15 IU/L (ref 0–65)
GLOBULIN, TOTAL: 2.3 g/dL (ref 1.5–4.5)
Glucose: 122 mg/dL — ABNORMAL HIGH (ref 65–99)
HDL: 68 mg/dL (ref >39)
Hematocrit: 44.3 % (ref 37.5–51.0)
Hemoglobin: 15.5 g/dL (ref 13.0–17.7)
IMMATURE GRANS (ABS): 0 10*3/uL (ref 0.0–0.1)
IRON: 86 ug/dL (ref 38–169)
Immature Granulocytes: 0 %
LDH: 151 IU/L (ref 121–224)
LDL Calculated: 74 mg/dL (ref 0–99)
LYMPHS: 24 %
Lymphocytes Absolute: 1.7 10*3/uL (ref 0.7–3.1)
MCH: 31.6 pg (ref 26.6–33.0)
MCHC: 35 g/dL (ref 31.5–35.7)
MCV: 90 fL (ref 79–97)
MONOS ABS: 0.5 10*3/uL (ref 0.1–0.9)
Monocytes: 7 %
NEUTROS ABS: 4.9 10*3/uL (ref 1.4–7.0)
Neutrophils: 68 %
PHOSPHORUS: 2.6 mg/dL (ref 2.5–4.5)
PLATELETS: 220 10*3/uL (ref 150–379)
POTASSIUM: 3.8 mmol/L (ref 3.5–5.2)
RBC: 4.91 x10E6/uL (ref 4.14–5.80)
RDW: 13.2 % (ref 12.3–15.4)
Sodium: 139 mmol/L (ref 134–144)
T3 UPTAKE RATIO: 26 % (ref 24–39)
T4 TOTAL: 6.3 ug/dL (ref 4.5–12.0)
TOTAL PROTEIN: 6.9 g/dL (ref 6.0–8.5)
TRIGLYCERIDES: 87 mg/dL (ref 0–149)
TSH: 0.907 u[IU]/mL (ref 0.450–4.500)
Uric Acid: 4.8 mg/dL (ref 3.7–8.6)
VLDL Cholesterol Cal: 17 mg/dL (ref 5–40)
WBC: 7.1 10*3/uL (ref 3.4–10.8)

## 2017-02-24 LAB — VITAMIN D 25 HYDROXY (VIT D DEFICIENCY, FRACTURES): VIT D 25 HYDROXY: 37 ng/mL (ref 30.0–100.0)

## 2017-02-24 MED ORDER — ASPIRIN EC 81 MG PO TBEC: 81.0000 mg | DELAYED_RELEASE_TABLET | Freq: Every day | ORAL | 1 refills | Status: DC

## 2017-02-24 MED ORDER — ASPIRIN 81 MG PO CHEW: 81.0000 mg | CHEWABLE_TABLET | Freq: Once | ORAL | Status: DC

## 2017-02-24 NOTE — Progress Notes (Signed)
Patient called by Durward Mallard and message left to return call. Both referrals have been done for cardiology and sleep study. Cardiology appointment was offered to patient on 02/27/17 but he will be away on vacation. He will call to reschedule appointment as soon as possible. Discussed labs with Dr. Rosanna Randy, glucose was not fasting. Bilirubin mildly elevated and can be repeated at a later time, follow up with primary care in August regarding labs unl;ess any new symptoms develop. Call the office.

## 2017-02-24 NOTE — Addendum Note (Signed)
Addended by: Doreen Beam on: 02/24/2017 01:29 PM   Modules accepted: Orders

## 2017-02-24 NOTE — Telephone Encounter (Signed)
Spoke with pt regarding cardiology referral to [place] [facility]. [facility] offered their earliest appointment on Monday 7/23@ 9:20am, however;  The patient will be out of town, so he was asked to give the heart care center a call to schedule at a time that is conveinient .

## 2017-02-24 NOTE — Telephone Encounter (Signed)
02/24/17 12:00 pm Nicholas Willis called [facility] as still awaiting STAT labs, [facility] reports they will fax over results.   02/24/17 Provider called patient for follow up to yesterdays visit, advised labs pending, provider will call with abnormal results.  Patient reports that he has taken  Metoprolol Succinate 25 mg yesterday and today and states " I am already feeling some better" . He reports he bought a pulse oximeter and has checked his pulse rate and today it is 75 beats per minute. He reports blood pressure today was 150/72.  He has also taken Aspirin 81 mg daily- has taken one today. He denies nay new symptoms, and no current cardiac symptoms, shortness of breath, nausea or vomiting. He does report that he is still not sleeping well at night with awakening. He requests referral to [facility] for sleep study and evaluation.  Patient advised emergency/ 911 symptoms and when to call. Patient verbalized understanding.  Discussed above with supervising physician Dr. Rosanna Randy and ok with referral for sleep study evaluation. Labs have also returned and discussed with Dr. Rosanna Randy, Bilirubin 1.5 mildly elevated. Will have patient follow up with primary care appointment regarding labs in August.

## 2017-02-25 DIAGNOSIS — R079 Chest pain, unspecified: Secondary | ICD-10-CM | POA: Insufficient documentation

## 2017-02-25 DIAGNOSIS — K219 Gastro-esophageal reflux disease without esophagitis: Secondary | ICD-10-CM | POA: Insufficient documentation

## 2017-02-25 DIAGNOSIS — F419 Anxiety disorder, unspecified: Secondary | ICD-10-CM | POA: Insufficient documentation

## 2017-02-25 DIAGNOSIS — I10 Essential (primary) hypertension: Secondary | ICD-10-CM | POA: Insufficient documentation

## 2017-02-25 NOTE — Progress Notes (Deleted)
Cardiology Office Note  Date:  02/25/2017   ID:  Nicholas Willis, DOB 06-Oct-1970, MRN [id number]  PCP:  No primary care provider on file.   No chief complaint on file.   HPI:  Mr. Nicholas Willis is a 46 year old children with history of Hypertension Barrett's esophagus, GERD treated with PPI Chest tightness when waking some mornings Anxiety, job stress  Good exercise tolerance elliptical 30-40 minutes  Prior stress test done several years ago for abnormal EKG, was told it was normal   PMH:   has a past medical history of GERD (gastroesophageal reflux disease) and Rosacea.  PSH:    Past Surgical History:  Procedure Laterality Date  . TONSILLECTOMY AND ADENOIDECTOMY      Current Outpatient Prescriptions  Medication Sig Dispense Refill  . acyclovir (ZOVIRAX) 200 MG capsule TAKE 1 CAPSULE (200 MG TOTAL) BY MOUTH 5 (FIVE) TIMES DAILY.    Marland Kitchen aspirin EC 81 MG tablet Take 1 tablet (81 mg total) by mouth daily. 30 tablet 1  . dexlansoprazole (DEXILANT) 60 MG capsule Take by mouth.    . doxycycline (VIBRA-TABS) 100 MG tablet Take 100 mg by mouth as needed.    . metoprolol succinate (TOPROL-XL) 25 MG 24 hr tablet Take 1 tablet (25 mg total) by mouth daily. 30 tablet 0   No current facility-administered medications for this visit.      Allergies:   Patient has no known allergies.   Social History:  The patient  reports that he has never smoked. He has never used smokeless tobacco. He reports that he drinks alcohol. He reports that he does not use drugs.   Family History:   family history includes GER disease in his father; Hypertension in his mother.    Review of Systems: ROS   PHYSICAL EXAM: VS:  There were no vitals taken for this visit. , BMI There is no height or weight on file to calculate BMI. GEN: Well nourished, well developed, in no acute distress HEENT: normal Neck: no JVD, carotid bruits, or masses Cardiac: RRR; no murmurs, rubs, or gallops,no edema  Respiratory:   clear to auscultation bilaterally, normal work of breathing GI: soft, nontender, nondistended, + BS MS: no deformity or atrophy Skin: warm and dry, no rash Neuro:  Strength and sensation are intact Psych: euthymic mood, full affect    Recent Labs: 02/23/2017: ALT 29; BUN 14; Creatinine, Ser 1.03; Hemoglobin 15.5; Platelets 220; Potassium 3.8; Sodium 139; TSH 0.907    Lipid Panel Lab Results  Component Value Date   CHOL 159 02/23/2017   HDL 68 02/23/2017   LDLCALC 74 02/23/2017   TRIG 87 02/23/2017      Wt Readings from Last 3 Encounters:  02/23/17 172 lb 12.8 oz (78.4 kg)  04/10/15 185 lb (83.9 kg)       ASSESSMENT AND PLAN:  No diagnosis found.   Disposition:   F/U  6 months  No orders of the defined types were placed in this encounter.    Signed, Esmond Plants, M.D., Ph.D. 02/25/2017  [place], [place]

## 2017-02-27 ENCOUNTER — Ambulatory Visit: Payer: BLUE CROSS/BLUE SHIELD | Admitting: Cardiovascular Disease

## 2017-03-06 ENCOUNTER — Encounter: Payer: Self-pay | Admitting: Medical

## 2017-03-06 ENCOUNTER — Ambulatory Visit: Payer: Self-pay | Admitting: Medical

## 2017-03-06 VITALS — BP 158/95 | HR 51 | Temp 97.7°F | Resp 16 | Ht 73.0 in | Wt 170.0 lb

## 2017-03-06 DIAGNOSIS — I10 Essential (primary) hypertension: Secondary | ICD-10-CM

## 2017-03-06 MED ORDER — AMLODIPINE BESYLATE 5 MG PO TABS: 5.0000 mg | ORAL_TABLET | Freq: Every day | ORAL | 1 refills | Status: DC

## 2017-03-06 NOTE — Patient Instructions (Signed)
Generalized Anxiety Disorder, Adult Generalized anxiety disorder (GAD) is a mental health disorder. People with this condition constantly worry about everyday events. Unlike normal anxiety, worry related to GAD is not triggered by a specific event. These worries also do not fade or get better with time. GAD interferes with life functions, including relationships, work, and school. GAD can vary from mild to severe. People with severe GAD can have intense waves of anxiety with physical symptoms (panic attacks). What are the causes? The exact cause of GAD is not known. What increases the risk? This condition is more likely to develop in:  Women.  People who have a family history of anxiety disorders.  People who are very shy.  People who experience very stressful life events, such as the death of a loved one.  People who have a very stressful family environment.  What are the signs or symptoms? People with GAD often worry excessively about many things in their lives, such as their health and family. They may also be overly concerned about:  Doing well at work.  Being on time.  Natural disasters.  Friendships.  Physical symptoms of GAD include:  Fatigue.  Muscle tension or having muscle twitches.  Trembling or feeling shaky.  Being easily startled.  Feeling like your heart is pounding or racing.  Feeling out of breath or like you cannot take a deep breath.  Having trouble falling asleep or staying asleep.  Sweating.  Nausea, diarrhea, or irritable bowel syndrome (IBS).  Headaches.  Trouble concentrating or remembering facts.  Restlessness.  Irritability.  How is this diagnosed? Your health care provider can diagnose GAD based on your symptoms and medical history. You will also have a physical exam. The health care provider will ask specific questions about your symptoms, including how severe they are, when they started, and if they come and go. Your health care  provider may ask you about your use of alcohol or drugs, including prescription medicines. Your health care provider may refer you to a mental health specialist for further evaluation. Your health care provider will do a thorough examination and may perform additional tests to rule out other possible causes of your symptoms. To be diagnosed with GAD, a person must have anxiety that:  Is out of his or her control.  Affects several different aspects of his or her life, such as work and relationships.  Causes distress that makes him or her unable to take part in normal activities.  Includes at least three physical symptoms of GAD, such as restlessness, fatigue, trouble concentrating, irritability, muscle tension, or sleep problems.  Before your health care provider can confirm a diagnosis of GAD, these symptoms must be present more days than they are not, and they must last for six months or longer. How is this treated? The following therapies are usually used to treat GAD:  Medicine. Antidepressant medicine is usually prescribed for long-term daily control. Antianxiety medicines may be added in severe cases, especially when panic attacks occur.  Talk therapy (psychotherapy). Certain types of talk therapy can be helpful in treating GAD by providing support, education, and guidance. Options include: ? Cognitive behavioral therapy (CBT). People learn coping skills and techniques to ease their anxiety. They learn to identify unrealistic or negative thoughts and behaviors and to replace them with positive ones. ? Acceptance and commitment therapy (ACT). This treatment teaches people how to be mindful as a way to cope with unwanted thoughts and feelings. ? Biofeedback. This process trains you to   manage your body's response (physiological response) through breathing techniques and relaxation methods. You will work with a therapist while machines are used to monitor your physical symptoms.  Stress  management techniques. These include yoga, meditation, and exercise.  A mental health specialist can help determine which treatment is best for you. Some people see improvement with one type of therapy. However, other people require a combination of therapies. Follow these instructions at home:  Take over-the-counter and prescription medicines only as told by your health care provider.  Try to maintain a normal routine.  Try to anticipate stressful situations and allow extra time to manage them.  Practice any stress management or self-calming techniques as taught by your health care provider.  Do not punish yourself for setbacks or for not making progress.  Try to recognize your accomplishments, even if they are small.  Keep all follow-up visits as told by your health care provider. This is important. Contact a health care provider if:  Your symptoms do not get better.  Your symptoms get worse.  You have signs of depression, such as: ? A persistently sad, cranky, or irritable mood. ? Loss of enjoyment in activities that used to bring you joy. ? Change in weight or eating. ? Changes in sleeping habits. ? Avoiding friends or family members. ? Loss of energy for normal tasks. ? Feelings of guilt or worthlessness. Get help right away if:  You have serious thoughts about hurting yourself or others. If you ever feel like you may hurt yourself or others, or have thoughts about taking your own life, get help right away. You can go to your nearest emergency department or call:  Your local emergency services (911 in the [place]  A suicide crisis helpline, such as the [place] at 7790758172. This is open 24 hours a day.  Summary  Generalized anxiety disorder (GAD) is a mental health disorder that involves worry that is not triggered by a specific event.  People with GAD often worry excessively about many things in their lives, such as their health and  family.  GAD may cause physical symptoms such as restlessness, trouble concentrating, sleep problems, frequent sweating, nausea, diarrhea, headaches, and trembling or muscle twitching.  A mental health specialist can help determine which treatment is best for you. Some people see improvement with one type of therapy. However, other people require a combination of therapies. This information is not intended to replace advice given to you by your health care provider. Make sure you discuss any questions you have with your health care provider. Document Released: 11/19/2012 Document Revised: 06/14/2016 Document Reviewed: 06/14/2016 Elsevier Interactive Patient Education  2018 [employer]. Hypertension Hypertension is another name for high blood pressure. High blood pressure forces your heart to work harder to pump blood. This can cause problems over time. There are two numbers in a blood pressure reading. There is a top number (systolic) over a bottom number (diastolic). It is best to have a blood pressure below 120/80. Healthy choices can help lower your blood pressure. You may need medicine to help lower your blood pressure if:  Your blood pressure cannot be lowered with healthy choices.  Your blood pressure is higher than 130/80.  Follow these instructions at home: Eating and drinking  If directed, follow the DASH eating plan. This diet includes: ? Filling half of your plate at each meal with fruits and vegetables. ? Filling one quarter of your plate at each meal with whole grains. Whole grains  include whole wheat pasta, brown rice, and whole grain bread. ? Eating or drinking low-fat dairy products, such as skim milk or low-fat yogurt. ? Filling one quarter of your plate at each meal with low-fat (lean) proteins. Low-fat proteins include fish, skinless chicken, eggs, beans, and tofu. ? Avoiding fatty meat, cured and processed meat, or chicken with skin. ? Avoiding premade or processed  food.  Eat less than 1,500 mg of salt (sodium) a day.  Limit alcohol use to no more than 1 drink a day for nonpregnant women and 2 drinks a day for men. One drink equals 12 oz of beer, 5 oz of wine, or 1 oz of hard liquor. Lifestyle  Work with your doctor to stay at a healthy weight or to lose weight. Ask your doctor what the best weight is for you.  Get at least 30 minutes of exercise that causes your heart to beat faster (aerobic exercise) most days of the week. This may include walking, swimming, or biking.  Get at least 30 minutes of exercise that strengthens your muscles (resistance exercise) at least 3 days a week. This may include lifting weights or pilates.  Do not use any products that contain nicotine or tobacco. This includes cigarettes and e-cigarettes. If you need help quitting, ask your doctor.  Check your blood pressure at home as told by your doctor.  Keep all follow-up visits as told by your doctor. This is important. Medicines  Take over-the-counter and prescription medicines only as told by your doctor. Follow directions carefully.  Do not skip doses of blood pressure medicine. The medicine does not work as well if you skip doses. Skipping doses also puts you at risk for problems.  Ask your doctor about side effects or reactions to medicines that you should watch for. Contact a doctor if:  You think you are having a reaction to the medicine you are taking.  You have headaches that keep coming back (recurring).  You feel dizzy.  You have swelling in your ankles.  You have trouble with your vision. Get help right away if:  You get a very bad headache.  You start to feel confused.  You feel weak or numb.  You feel faint.  You get very bad pain in your: ? Chest. ? Belly (abdomen).  You throw up (vomit) more than once.  You have trouble breathing. Summary  Hypertension is another name for high blood pressure.  Making healthy choices can help  lower blood pressure. If your blood pressure cannot be controlled with healthy choices, you may need to take medicine. This information is not intended to replace advice given to you by your health care provider. Make sure you discuss any questions you have with your health care provider. Document Released: 01/11/2008 Document Revised: 06/22/2016 Document Reviewed: 06/22/2016 Elsevier Interactive Patient Education  [employer].

## 2017-03-06 NOTE — Progress Notes (Signed)
   Subjective:    Patient ID: Nicholas Willis, male    DOB: Apr 02, 1971, 46 y.o.   MRN: [id number]  HPI 46 yo male eturns to the clinic for blood pressure check. Returned from vacation , feeling well, had one day last week just felt lethargic, more anxious, no chest tightness or chest pain, or shortness of breath or dizziness..  Today feels fine.  He also thinks he may have sleep apnea , his father has sleep apnea. Patient does state he snores.  Pending cardiology  Appointment with Dr. Rockey Situ and sleep study with DSr. Heidi Dach.   Review of Systems  Constitutional: Positive for fatigue. Negative for chills and unexpected weight change.  HENT: Negative for congestion, ear discharge, sinus pressure and sore throat.   Eyes: Negative for discharge and itching.  Respiratory: Negative for cough and shortness of breath.   Cardiovascular: Positive for palpitations. Negative for chest pain and leg swelling.  Gastrointestinal: Negative for abdominal pain.  Endocrine: Negative for polydipsia, polyphagia and polyuria.  Genitourinary: Negative for dysuria and hematuria.  Musculoskeletal: Negative for myalgias.  Skin: Negative for rash.  Allergic/Immunologic: Negative for environmental allergies and food allergies.  Neurological: Positive for headaches. Negative for dizziness, syncope and light-headedness.  Hematological: Negative for adenopathy.  Psychiatric/Behavioral: Positive for sleep disturbance. Negative for agitation, behavioral problems, confusion, self-injury and suicidal ideas. The patient is nervous/anxious.       More conscious  Of heart beat at night thinking about things.  mild headache on on crown of head with elevated. Objective:   Physical Exam  Constitutional: He is oriented to person, place, and time. He appears well-developed and well-nourished.  HENT:  Head: Normocephalic and atraumatic.  Eyes: Pupils are equal, round, and reactive to light. Conjunctivae and EOM are  normal.  Cardiovascular: Normal rate, regular rhythm and normal heart sounds.  Exam reveals no gallop.   No murmur heard. Neurological: He is alert and oriented to person, place, and time.  Skin: Skin is warm and dry.  Psychiatric: He has a normal mood and affect. His behavior is normal. Judgment and thought content normal.  Nursing note and vitals reviewed.         Assessment & Plan:  Hypertension , Anxiety To continue Metoprolol 25 mg /day not to take if heart rate less than 60 bpm.Also to continue Aspirin 81 mg daily. Would like to start an antidepressive medication. Will hold off till clearance from cardiology appointments set for  August 28th with Dr. Rockey Situ and also pending sleep study consult on Thursday February 06, 2017.  Reviewed signs and symptoms of when to go to the Emergency department or to call  911, chest pain, shortness of breath, dizziness, nausea, vomiting, sweating. Reviewed vitals with Dr. Rosanna Randy, he recommends  Amlodipine 5 mg one by mouth once a day with  2 week check up. Has prmary physical with his Dr. Clover Mealy on Friday. Reviewed labs with patient. He states this was nonfasting labs and His Bilirubin is always a little elevated.

## 2017-03-17 ENCOUNTER — Encounter: Payer: Self-pay | Admitting: Adult Health

## 2017-03-17 ENCOUNTER — Telehealth: Payer: Self-pay | Admitting: Adult Health

## 2017-03-17 DIAGNOSIS — I10 Essential (primary) hypertension: Secondary | ICD-10-CM

## 2017-03-17 MED ORDER — METOPROLOL SUCCINATE ER 25 MG PO TB24: 25.0000 mg | ORAL_TABLET | Freq: Every day | ORAL | 0 refills | Status: DC

## 2017-03-17 NOTE — Telephone Encounter (Signed)
Patient called 03/17/2017 needing refill on Metoprolol Succinate 25 mg 24 hour tablet. He reports having 4 tablets left.  He saw his primary care physician on 03/10/17 for follow up. He was advised to continue same regimen of medications per his reports and keep a log of his blood pressure readings for three weeks and then will return to his primary car physician for evaluation and follow up at this time. He report blood pressure readings have been 130/83 range with heart rate 84 today. He also reports he reviewed these with his primary care last week.   He has a cardiologist next week.   Meds ordered this encounter  Medications  . metoprolol succinate (TOPROL-XL) 25 MG 24 hr tablet    Sig: Take 1 tablet (25 mg total) by mouth daily.    Dispense:  30 tablet    Refill:  0   E - prescribed as above to [facility] [address] and patient is advised to follow up with PCP for labs and any additional refills. He verbalizes understanding and has no further questions at this time.

## 2017-03-20 ENCOUNTER — Ambulatory Visit: Payer: Self-pay | Admitting: Medical

## 2017-03-28 DIAGNOSIS — R002 Palpitations: Secondary | ICD-10-CM | POA: Insufficient documentation

## 2017-04-02 DIAGNOSIS — I77810 Thoracic aortic ectasia: Secondary | ICD-10-CM | POA: Insufficient documentation

## 2017-04-02 NOTE — Progress Notes (Deleted)
Cardiology Office Note  Date:  04/02/2017   ID:  Nicholas Willis, DOB [birth year], MRN [id number]  PCP:  System, Pcp Not In   No chief complaint on file.   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   PMH:   has a past medical history of GERD (gastroesophageal reflux disease) and Rosacea.  PSH:    Past Surgical History:  Procedure Laterality Date  . TONSILLECTOMY AND ADENOIDECTOMY      Current Outpatient Prescriptions  Medication Sig Dispense Refill  . acyclovir (ZOVIRAX) 200 MG capsule TAKE 1 CAPSULE (200 MG TOTAL) BY MOUTH 5 (FIVE) TIMES DAILY.    Marland Kitchen amLODipine (NORVASC) 5 MG tablet Take 1 tablet (5 mg total) by mouth daily. 30 tablet 1  . aspirin EC 81 MG tablet Take 1 tablet (81 mg total) by mouth daily. (Patient not taking: Reported on 03/06/2017) 30 tablet 1  . dexlansoprazole (DEXILANT) 60 MG capsule Take by mouth.    . doxycycline (VIBRA-TABS) 100 MG tablet Take 100 mg by mouth as needed.    . metoprolol succinate (TOPROL-XL) 25 MG 24 hr tablet Take 1 tablet (25 mg total) by mouth daily. 30 tablet 0   No current facility-administered medications for this visit.      Allergies:   Patient has no known allergies.   Social History:  The patient  reports that he has never smoked. He has never used smokeless tobacco. He reports that he drinks alcohol. He reports that he does not use drugs.   Family History:   family history includes GER disease in his father; Hypertension in his mother.    Review of Systems: ROS   PHYSICAL EXAM: VS:  There were no vitals taken for this visit. , BMI There is no height or weight on file to calculate BMI. GEN: Well nourished, well developed, in no acute  distress  HEENT: normal  Neck: no JVD, carotid bruits, or masses Cardiac: RRR; no murmurs, rubs, or gallops,no edema  Respiratory:  clear to auscultation bilaterally, normal work of breathing GI: soft, nontender, nondistended, + BS MS: no deformity or atrophy  Skin: warm and dry, no rash Neuro:  Strength and sensation are intact Psych: euthymic mood, full affect    Recent Labs: 02/23/2017: ALT 29; BUN 14; Creatinine, Ser 1.03; Hemoglobin 15.5; Platelets 220; Potassium 3.8; Sodium 139; TSH 0.907    Lipid Panel Lab Results  Component Value Date   CHOL 159 02/23/2017   HDL 68 02/23/2017   LDLCALC 74 02/23/2017   TRIG 87 02/23/2017      Wt Readings from Last 3 Encounters:  03/06/17 170 lb (77.1 kg)  02/23/17 172 lb 12.8 oz (78.4 kg)  04/10/15 185 lb (83.9 kg)       ASSESSMENT AND PLAN:  No diagnosis found.   Disposition:   F/U  6 months  No orders of the defined types were placed in this encounter.    Signed, Esmond Plants, M.D., Ph.D. 04/02/2017  [place], [place]

## 2017-04-03 ENCOUNTER — Telehealth: Payer: Self-pay

## 2017-04-03 NOTE — Telephone Encounter (Signed)
Patient called to schedule new patient appt from referral .  He was seen in [place] by another provider and is not interested in scheduling.

## 2017-04-04 ENCOUNTER — Ambulatory Visit: Payer: BLUE CROSS/BLUE SHIELD | Admitting: Cardiovascular Disease

## 2017-09-20 ENCOUNTER — Ambulatory Visit: Payer: BLUE CROSS/BLUE SHIELD

## 2017-09-20 ENCOUNTER — Encounter: Payer: Self-pay | Admitting: Adult Health

## 2017-09-20 DIAGNOSIS — G4733 Obstructive sleep apnea (adult) (pediatric): Secondary | ICD-10-CM | POA: Diagnosis not present

## 2017-09-20 NOTE — Progress Notes (Signed)
95 percentile pressure 12.3   95th percentile leak 40.3   apnea index 6.2 /hr  apnea-hypopnea index  6.9 /hr   total days used  >4 hr 13 days  total days used <4 hr 5 days  Total compliance 65 percent  Nicholas Willis is having trouble with taking mask off during night, I suggested a new mask. Also with leaking he has noticed he needs to change mask seals more often. He puts machine on everynight, and will continue to try to keep it on

## 2017-09-20 NOTE — Telephone Encounter (Signed)
Error not contacted - called Epic To remove encounter - no telephone call was made or documented

## 2017-10-26 ENCOUNTER — Encounter: Payer: Self-pay | Admitting: Internal Medicine

## 2017-10-26 ENCOUNTER — Ambulatory Visit: Payer: BLUE CROSS/BLUE SHIELD | Admitting: Internal Medicine

## 2017-10-26 VITALS — BP 136/94 | HR 95 | Resp 16 | Ht 73.0 in | Wt 180.2 lb

## 2017-10-26 DIAGNOSIS — G4733 Obstructive sleep apnea (adult) (pediatric): Secondary | ICD-10-CM

## 2017-10-26 DIAGNOSIS — K219 Gastro-esophageal reflux disease without esophagitis: Secondary | ICD-10-CM

## 2017-10-26 DIAGNOSIS — Z9989 Dependence on other enabling machines and devices: Secondary | ICD-10-CM | POA: Diagnosis not present

## 2017-10-26 NOTE — Progress Notes (Signed)
[facility], [place] [address]  Pulmonary Sleep Medicine  Office Visit Note  Patient Name: Nicholas Willis DOB: 1971-06-12 MRN [id number]  Date of Service: 10/26/2017   Complaints/HPI:  He has been having some issues with his mask.  Has been struggling with compliance because of increased difficulty with the mask 50. Patient is going to work with trying to get another mask care I spoke with him about getting the mass that works he is going to try CPAP doc on  ROS  General: (-) fever, (-) chills, (-) night sweats, (-) weakness Skin: (-) rashes, (-) itching,. Eyes: (-) visual changes, (-) redness, (-) itching. Nose and Sinuses: (-) nasal stuffiness or itchiness, (-) postnasal drip, (-) nosebleeds, (-) sinus trouble. Mouth and Throat: (-) sore throat, (-) hoarseness. Neck: (-) swollen glands, (-) enlarged thyroid, (-) neck pain. Respiratory: - cough, (-) bloody sputum, - shortness of breath, - wheezing. Cardiovascular: - ankle swelling, (-) chest pain. Lymphatic: (-) lymph node enlargement. Neurologic: (-) numbness, (-) tingling. Psychiatric: (-) anxiety, (-) depression   Current Medication: Outpatient Encounter Medications as of 10/26/2017  Medication Sig  . acyclovir (ZOVIRAX) 200 MG capsule TAKE 1 CAPSULE (200 MG TOTAL) BY MOUTH 5 (FIVE) TIMES DAILY.  Marland Kitchen dexlansoprazole (DEXILANT) 60 MG capsule Take by mouth.  . doxycycline (VIBRA-TABS) 100 MG tablet Take 100 mg by mouth as needed.  Marland Kitchen amLODipine (NORVASC) 5 MG tablet Take 1 tablet (5 mg total) by mouth daily. (Patient not taking: Reported on 10/26/2017)  . aspirin EC 81 MG tablet Take 1 tablet (81 mg total) by mouth daily. (Patient not taking: Reported on 03/06/2017)  . losartan-hydrochlorothiazide (HYZAAR) 50-12.5 MG tablet Take 1 tablet by mouth daily.  . metoprolol succinate (TOPROL-XL) 25 MG 24 hr tablet Take 1 tablet (25 mg total) by mouth daily. (Patient not taking: Reported on  10/26/2017)   No facility-administered encounter medications on file as of 10/26/2017.     Surgical History: Past Surgical History:  Procedure Laterality Date  . ADENOIDECTOMY      Medical History: Past Medical History:  Diagnosis Date  . GERD (gastroesophageal reflux disease)   . Rosacea     Family History: Family History  Problem Relation Age of Onset  . Hypertension Mother   . GER disease Father     Social History: Social History   Socioeconomic History  . Marital status: Married    Spouse name: Not on file  . Number of children: Not on file  . Years of education: Not on file  . Highest education level: Not on file  Occupational History  . Not on file  Social Needs  . Financial resource strain: Not on file  . Food insecurity:    Worry: Not on file    Inability: Not on file  . Transportation needs:    Medical: Not on file    Non-medical: Not on file  Tobacco Use  . Smoking status: Never Smoker  . Smokeless tobacco: Never Used  Substance and Sexual Activity  . Alcohol use: Yes    Comment: social  . Drug use: No  . Sexual activity: Not on file  Lifestyle  . Physical activity:    Days per week: Not on file    Minutes per session: Not on file  . Stress: Not on file  Relationships  . Social connections:    Talks on phone: Not on file    Gets together: Not on file    Attends  religious service: Not on file    Active member of club or organization: Not on file    Attends meetings of clubs or organizations: Not on file    Relationship status: Not on file  . Intimate partner violence:    Fear of current or ex partner: Not on file    Emotionally abused: Not on file    Physically abused: Not on file    Forced sexual activity: Not on file  Other Topics Concern  . Not on file  Social History Narrative  . Not on file    Vital Signs: Blood pressure (!) 136/94, pulse 95, resp. rate 16, height 6\' 1"  (1.854 m), weight 180 lb 3.2 oz (81.7 kg), SpO2 97  %.  Examination: General Appearance: The patient is well-developed, well-nourished, and in no distress. Skin: Gross inspection of skin unremarkable. Head: normocephalic, no gross deformities. Eyes: no gross deformities noted. ENT: ears appear grossly normal no exudates. Neck: Supple. No thyromegaly. No LAD. Respiratory: clear at this time. Cardiovascular: Normal S1 and S2 without murmur or rub. Extremities: No cyanosis. pulses are equal. Neurologic: Alert and oriented. No involuntary movements.  LABS: No results found for this or any previous visit (from the past 2160 hour(s)).  Radiology: No results found.  No results found.  No results found.    Assessment and Plan: Patient Active Problem List   Diagnosis Date Noted  . Ascending aorta dilatation (HCC) 04/02/2017  . Palpitations 03/28/2017  . Anxiety 02/25/2017  . Chest pain 02/25/2017  . Essential hypertension 02/25/2017  . Gastroesophageal reflux disease 02/25/2017  . Barrett's esophagus 09/19/2013  . Epigastric pain 03/21/2013    1. OSA continue with CPAP auto titration at this time.  Mask 50 seems to be major ratio and to he is going to check on line to see if he is able to get the proper mask which he currently states is the resmed F 30 2. GERD controlled at this time we will continue with supportive care  General Counseling: I have discussed the findings of the evaluation and examination with [name].  I have also discussed any further diagnostic evaluation thatmay be needed or ordered today. Navon verbalizes understanding of the findings of todays visit. We also reviewed his medications today and discussed drug interactions and side effects including but not limited excessive drowsiness and altered mental states. We also discussed that there is always a risk not just to him but also people around him. he has been encouraged to call the office with any questions or concerns that should arise related to  todays visit.    Time spent: 57min  I have personally obtained a history, examined the patient, evaluated laboratory and imaging results, formulated the assessment and plan and placed orders.    Allyne Gee, MD [facility] Pulmonary and Critical Care Sleep medicine

## 2017-10-26 NOTE — Patient Instructions (Signed)

## 2017-12-12 ENCOUNTER — Ambulatory Visit: Payer: BLUE CROSS/BLUE SHIELD | Admitting: Internal Medicine

## 2017-12-12 ENCOUNTER — Encounter: Payer: Self-pay | Admitting: Internal Medicine

## 2017-12-12 VITALS — BP 144/80 | HR 91 | Resp 16 | Ht 72.0 in | Wt 177.8 lb

## 2017-12-12 DIAGNOSIS — K219 Gastro-esophageal reflux disease without esophagitis: Secondary | ICD-10-CM

## 2017-12-12 DIAGNOSIS — F419 Anxiety disorder, unspecified: Secondary | ICD-10-CM

## 2017-12-12 DIAGNOSIS — Z9989 Dependence on other enabling machines and devices: Secondary | ICD-10-CM

## 2017-12-12 DIAGNOSIS — G4733 Obstructive sleep apnea (adult) (pediatric): Secondary | ICD-10-CM | POA: Diagnosis not present

## 2017-12-12 DIAGNOSIS — G4700 Insomnia, unspecified: Secondary | ICD-10-CM | POA: Diagnosis not present

## 2017-12-12 MED ORDER — ZOLPIDEM TARTRATE ER 6.25 MG PO TBCR
6.2500 mg | EXTENDED_RELEASE_TABLET | Freq: Every evening | ORAL | 0 refills | Status: DC | PRN
Start: 2017-12-12 — End: 2018-05-03

## 2017-12-12 NOTE — Patient Instructions (Signed)

## 2017-12-12 NOTE — Progress Notes (Signed)
[facility] [place], [place] [address]  Pulmonary Sleep Medicine   Office Visit Note  Patient Name: Nicholas Willis DOB: October 15, 1970 MRN [id number]  Date of Service: 12/12/2017  Complaints/HPI:   He is complaining of having difficulty maintaining his sleep.  He wakes up at 4:00 a.m. Morning with GR and then states he cannot get back to sleep Nicholas Willis his compliance is actually been down also he was 100% compliance and November now is compliance 65% mask leak has increased also and therefore he may need a different mask which she wants to avoid to change mask is apnea-hypopnea index is unchanged the support the stoma  ROS  General: (-) fever, (-) chills, (-) night sweats, (-) weakness Skin: (-) rashes, (-) itching,. Eyes: (-) visual changes, (-) redness, (-) itching. Nose and Sinuses: (-) nasal stuffiness or itchiness, (-) postnasal drip, (-) nosebleeds, (-) sinus trouble. Mouth and Throat: (-) sore throat, (-) hoarseness. Neck: (-) swollen glands, (-) enlarged thyroid, (-) neck pain. Respiratory: - cough, (-) bloody sputum, - shortness of breath, - wheezing. Cardiovascular: - ankle swelling, (-) chest pain. Lymphatic: (-) lymph node enlargement. Neurologic: (-) numbness, (-) tingling. Psychiatric: (-) anxiety, (-) depression   Current Medication: Outpatient Encounter Medications as of 12/12/2017  Medication Sig  . acyclovir (ZOVIRAX) 200 MG capsule TAKE 1 CAPSULE (200 MG TOTAL) BY MOUTH 5 (FIVE) TIMES DAILY.  Marland Kitchen amLODipine (NORVASC) 5 MG tablet Take 1 tablet (5 mg total) by mouth daily. (Patient not taking: Reported on 10/26/2017)  . aspirin EC 81 MG tablet Take 1 tablet (81 mg total) by mouth daily. (Patient not taking: Reported on 03/06/2017)  . dexlansoprazole (DEXILANT) 60 MG capsule Take by mouth.  . doxycycline (VIBRA-TABS) 100 MG tablet Take 100 mg by mouth as needed.  Marland Kitchen losartan-hydrochlorothiazide (HYZAAR) 50-12.5 MG tablet Take 1 tablet by mouth  daily.  . metoprolol succinate (TOPROL-XL) 25 MG 24 hr tablet Take 1 tablet (25 mg total) by mouth daily. (Patient not taking: Reported on 10/26/2017)   No facility-administered encounter medications on file as of 12/12/2017.     Surgical History: Past Surgical History:  Procedure Laterality Date  . ADENOIDECTOMY      Medical History: Past Medical History:  Diagnosis Date  . GERD (gastroesophageal reflux disease)   . Rosacea     Family History: Family History  Problem Relation Age of Onset  . Hypertension Mother   . GER disease Father     Social History: Social History   Socioeconomic History  . Marital status: Married    Spouse name: Not on file  . Number of children: Not on file  . Years of education: Not on file  . Highest education level: Not on file  Occupational History  . Not on file  Social Needs  . Financial resource strain: Not on file  . Food insecurity:    Worry: Not on file    Inability: Not on file  . Transportation needs:    Medical: Not on file    Non-medical: Not on file  Tobacco Use  . Smoking status: Never Smoker  . Smokeless tobacco: Never Used  Substance and Sexual Activity  . Alcohol use: Yes    Comment: social  . Drug use: No  . Sexual activity: Not on file  Lifestyle  . Physical activity:    Days per week: Not on file    Minutes per session: Not on file  . Stress: Not on file  Relationships  .  Social connections:    Talks on phone: Not on file    Gets together: Not on file    Attends religious service: Not on file    Active member of club or organization: Not on file    Attends meetings of clubs or organizations: Not on file    Relationship status: Not on file  . Intimate partner violence:    Fear of current or ex partner: Not on file    Emotionally abused: Not on file    Physically abused: Not on file    Forced sexual activity: Not on file  Other Topics Concern  . Not on file  Social History Narrative  . Not on file     Vital Signs: Blood pressure (!) 144/80, pulse 91, resp. rate 16, height 6' (1.829 m), weight 177 lb 12.8 oz (80.6 kg), SpO2 98 %.  Examination: General Appearance: The patient is well-developed, well-nourished, and in no distress. Skin: Gross inspection of skin unremarkable. Head: normocephalic, no gross deformities. Eyes: no gross deformities noted. ENT: ears appear grossly normal no exudates. Neck: Supple. No thyromegaly. No LAD. Respiratory: no rhonchi noted. Cardiovascular: Normal S1 and S2 without murmur or rub. Extremities: No cyanosis. pulses are equal. Neurologic: Alert and oriented. No involuntary movements.  LABS: No results found for this or any previous visit (from the past 2160 hour(s)).  Radiology: No results found.  No results found.  No results found.    Assessment and Plan: Patient Active Problem List   Diagnosis Date Noted  . Ascending aorta dilatation (HCC) 04/02/2017  . Palpitations 03/28/2017  . Anxiety 02/25/2017  . Chest pain 02/25/2017  . Essential hypertension 02/25/2017  . Gastroesophageal reflux disease 02/25/2017  . Barrett's esophagus 09/19/2013  . Epigastric pain 03/21/2013    1. OSA I would continue without titration as has been ordered.  We will continue with suggesting a mask cahnge 2. Insomnia prescription was given for Ambien CR 6.25 mg to be used as needed 3. GERD reflux has been stable 4. Anxiety this may be the main underlying issue with him at this time driving his insomnia so I am hopeful that a short-term course of Ambien may helpful.  He did also state that there is a lot of issues going on at work and so therefore he is at more pressure work  General Counseling: I have discussed the findings of the evaluation and examination with Nicholas Willis.  I have also discussed any further diagnostic evaluation thatmay be needed or ordered today. Nicholas Willis verbalizes understanding of the findings of todays visit. We also reviewed his  medications today and discussed drug interactions and side effects including but not limited excessive drowsiness and altered mental states. We also discussed that there is always a risk not just to him but also people around him. he has been encouraged to call the office with any questions or concerns that should arise related to todays visit.    Time spent: 43min  I have personally obtained a history, examined the patient, evaluated laboratory and imaging results, formulated the assessment and plan and placed orders.    Allyne Gee, MD [facility] Pulmonary and Critical Care Sleep medicine

## 2017-12-14 DIAGNOSIS — Z9989 Dependence on other enabling machines and devices: Secondary | ICD-10-CM

## 2017-12-14 DIAGNOSIS — G4733 Obstructive sleep apnea (adult) (pediatric): Secondary | ICD-10-CM | POA: Insufficient documentation

## 2018-02-01 ENCOUNTER — Telehealth: Payer: Self-pay | Admitting: Internal Medicine

## 2018-02-01 NOTE — Telephone Encounter (Signed)
Patient having issues with cpap machine and pressure causing him to wake up at night, gave AHP verbal order to increase his pressure to 11 and see how that pressure works for him. Beth

## 2018-03-21 ENCOUNTER — Ambulatory Visit: Payer: Self-pay

## 2018-05-01 ENCOUNTER — Ambulatory Visit: Payer: Self-pay

## 2018-05-03 ENCOUNTER — Ambulatory Visit: Payer: BLUE CROSS/BLUE SHIELD | Admitting: Internal Medicine

## 2018-05-03 ENCOUNTER — Encounter: Payer: Self-pay | Admitting: Internal Medicine

## 2018-05-03 VITALS — BP 132/92 | HR 79 | Resp 16 | Ht 73.0 in | Wt 184.0 lb

## 2018-05-03 DIAGNOSIS — G4733 Obstructive sleep apnea (adult) (pediatric): Secondary | ICD-10-CM

## 2018-05-03 DIAGNOSIS — G4700 Insomnia, unspecified: Secondary | ICD-10-CM | POA: Diagnosis not present

## 2018-05-03 DIAGNOSIS — Z9989 Dependence on other enabling machines and devices: Secondary | ICD-10-CM

## 2018-05-03 DIAGNOSIS — K219 Gastro-esophageal reflux disease without esophagitis: Secondary | ICD-10-CM | POA: Diagnosis not present

## 2018-05-03 DIAGNOSIS — I10 Essential (primary) hypertension: Secondary | ICD-10-CM

## 2018-05-03 MED ORDER — ZOLPIDEM TARTRATE ER 6.25 MG PO TBCR: 6.2500 mg | EXTENDED_RELEASE_TABLET | Freq: Every evening | ORAL | 0 refills | Status: DC | PRN

## 2018-05-03 NOTE — Patient Instructions (Signed)

## 2018-05-03 NOTE — Progress Notes (Signed)
[facility] [place], [place] [address]  Pulmonary Sleep Medicine   Office Visit Note  Patient Name: Nicholas Willis DOB: March 24, 1971 MRN [id number]  Date of Service: 05/03/2018  Complaints/HPI: Pt here for follow up on OSA, insomnia.  He reports since his last visit he has had near 100% compliance.  He denies mask leak. No other issues to report.  Denies chest pain, sinus issues or headaches. He cleans his cpap machine with soap and water, or vinegar and water.  He is changing your seal, mask and tubing every few months.   ROS  General: (-) fever, (-) chills, (-) night sweats, (-) weakness Skin: (-) rashes, (-) itching,. Eyes: (-) visual changes, (-) redness, (-) itching. Nose and Sinuses: (-) nasal stuffiness or itchiness, (-) postnasal drip, (-) nosebleeds, (-) sinus trouble. Mouth and Throat: (-) sore throat, (-) hoarseness. Neck: (-) swollen glands, (-) enlarged thyroid, (-) neck pain. Respiratory: - cough, (-) bloody sputum, - shortness of breath, - wheezing. Cardiovascular: - ankle swelling, (-) chest pain. Lymphatic: (-) lymph node enlargement. Neurologic: (-) numbness, (-) tingling. Psychiatric: (-) anxiety, (-) depression   Current Medication: Outpatient Encounter Medications as of 05/03/2018  Medication Sig  . acyclovir (ZOVIRAX) 200 MG capsule TAKE 1 CAPSULE (200 MG TOTAL) BY MOUTH 5 (FIVE) TIMES DAILY.  Marland Kitchen dexlansoprazole (DEXILANT) 60 MG capsule Take by mouth.  . hydrochlorothiazide (HYDRODIURIL) 12.5 MG tablet Take 12.5 mg by mouth daily.  Marland Kitchen losartan (COZAAR) 50 MG tablet Take 50 mg by mouth daily.  Marland Kitchen zolpidem (AMBIEN CR) 6.25 MG CR tablet Take 1 tablet (6.25 mg total) by mouth at bedtime as needed for sleep.  . [DISCONTINUED] losartan-hydrochlorothiazide (HYZAAR) 50-12.5 MG tablet Take 1 tablet by mouth daily.  . [DISCONTINUED] zolpidem (AMBIEN CR) 6.25 MG CR tablet Take 1 tablet (6.25 mg total) by mouth at bedtime as needed for  sleep.  Marland Kitchen doxycycline (VIBRA-TABS) 100 MG tablet Take 100 mg by mouth as needed.  . [DISCONTINUED] amLODipine (NORVASC) 5 MG tablet Take 1 tablet (5 mg total) by mouth daily. (Patient not taking: Reported on 10/26/2017)  . [DISCONTINUED] aspirin EC 81 MG tablet Take 1 tablet (81 mg total) by mouth daily. (Patient not taking: Reported on 03/06/2017)  . [DISCONTINUED] metoprolol succinate (TOPROL-XL) 25 MG 24 hr tablet Take 1 tablet (25 mg total) by mouth daily. (Patient not taking: Reported on 10/26/2017)   No facility-administered encounter medications on file as of 05/03/2018.     Surgical History: Past Surgical History:  Procedure Laterality Date  . ADENOIDECTOMY      Medical History: Past Medical History:  Diagnosis Date  . GERD (gastroesophageal reflux disease)   . Rosacea     Family History: Family History  Problem Relation Age of Onset  . Hypertension Mother   . GER disease Father     Social History: Social History   Socioeconomic History  . Marital status: Married    Spouse name: Not on file  . Number of children: Not on file  . Years of education: Not on file  . Highest education level: Not on file  Occupational History  . Not on file  Social Needs  . Financial resource strain: Not on file  . Food insecurity:    Worry: Not on file    Inability: Not on file  . Transportation needs:    Medical: Not on file    Non-medical: Not on file  Tobacco Use  . Smoking status: Never Smoker  .  Smokeless tobacco: Never Used  Substance and Sexual Activity  . Alcohol use: Yes    Comment: social  . Drug use: No  . Sexual activity: Not on file  Lifestyle  . Physical activity:    Days per week: Not on file    Minutes per session: Not on file  . Stress: Not on file  Relationships  . Social connections:    Talks on phone: Not on file    Gets together: Not on file    Attends religious service: Not on file    Active member of club or organization: Not on file    Attends  meetings of clubs or organizations: Not on file    Relationship status: Not on file  . Intimate partner violence:    Fear of current or ex partner: Not on file    Emotionally abused: Not on file    Physically abused: Not on file    Forced sexual activity: Not on file  Other Topics Concern  . Not on file  Social History Narrative  . Not on file    Vital Signs: Blood pressure (!) 132/92, pulse 79, resp. rate 16, height 6\' 1"  (1.854 m), weight 184 lb (83.5 kg), SpO2 97 %.  Examination: General Appearance: The patient is well-developed, well-nourished, and in no distress. Skin: Gross inspection of skin unremarkable. Head: normocephalic, no gross deformities. Eyes: no gross deformities noted. ENT: ears appear grossly normal no exudates. Neck: Supple. No thyromegaly. No LAD. Respiratory: Clear to auscultation bilaterly. Cardiovascular: Normal S1 and S2 without murmur or rub. Extremities: No cyanosis. pulses are equal. Neurologic: Alert and oriented. No involuntary movements.  LABS: No results found for this or any previous visit (from the past 2160 hour(s)).  Radiology: No results found.  No results found.  No results found.    Assessment and Plan: Patient Active Problem List   Diagnosis Date Noted  . Ascending aorta dilatation (HCC) 04/02/2017  . Palpitations 03/28/2017  . Anxiety 02/25/2017  . Chest pain 02/25/2017  . Essential hypertension 02/25/2017  . Gastroesophageal reflux disease 02/25/2017  . Barrett's esophagus 09/19/2013  . Epigastric pain 03/21/2013   1. OSA on CPAP Continue to use CPAP as directed. Patient is happy with current mask selection and is happy to have a good fit at this time.   2. Insomnia, unspecified type Refilled Ambien. - zolpidem (AMBIEN CR) 6.25 MG CR tablet; Take 1 tablet (6.25 mg total) by mouth at bedtime as needed for sleep.  Dispense: 30 tablet; Refill: 0  3. Gastroesophageal reflux disease without esophagitis Well controlled  with Dexalan.  4. Essential hypertension Elevated today, Working with his primary care to treat this.    General Counseling: I have discussed the findings of the evaluation and examination with Nicholas Willis.  I have also discussed any further diagnostic evaluation thatmay be needed or ordered today. Krystofer verbalizes understanding of the findings of todays visit. We also reviewed his medications today and discussed drug interactions and side effects including but not limited excessive drowsiness and altered mental states. We also discussed that there is always a risk not just to him but also people around him. he has been encouraged to call the office with any questions or concerns that should arise related to todays visit.    Time spent: 25 This patient was seen by Orson Gear AGNP-C in Collaboration with Dr. Devona Konig as a part of collaborative care agreement.   I have personally obtained a history, examined the patient, evaluated  laboratory and imaging results, formulated the assessment and plan and placed orders.    Allyne Gee, MD [facility] Pulmonary and Critical Care Sleep medicine

## 2018-06-22 ENCOUNTER — Ambulatory Visit: Payer: Self-pay | Admitting: Adult Health

## 2018-06-22 ENCOUNTER — Encounter: Payer: Self-pay | Admitting: Adult Health

## 2018-06-22 VITALS — BP 130/80 | HR 97 | Temp 98.0°F | Resp 16 | Ht 73.0 in | Wt 183.0 lb

## 2018-06-22 DIAGNOSIS — K22719 Barrett's esophagus with dysplasia, unspecified: Secondary | ICD-10-CM

## 2018-06-22 DIAGNOSIS — J0100 Acute maxillary sinusitis, unspecified: Secondary | ICD-10-CM

## 2018-06-22 MED ORDER — DEXLANSOPRAZOLE 60 MG PO CPDR: 60.0000 mg | DELAYED_RELEASE_CAPSULE | Freq: Every day | ORAL | 0 refills | Status: DC

## 2018-06-22 MED ORDER — CETIRIZINE HCL 10 MG PO TABS: 10.0000 mg | ORAL_TABLET | Freq: Every day | ORAL | 2 refills | Status: DC

## 2018-06-22 MED ORDER — PREDNISONE 10 MG (21) PO TBPK: ORAL_TABLET | ORAL | 0 refills | Status: DC

## 2018-06-22 MED ORDER — FLUTICASONE PROPIONATE 50 MCG/ACT NA SUSP: 2.0000 | Freq: Every day | NASAL | 6 refills | Status: DC

## 2018-06-22 MED ORDER — AMOXICILLIN-POT CLAVULANATE 875-125 MG PO TABS: 1.0000 | ORAL_TABLET | Freq: Two times a day (BID) | ORAL | 0 refills | Status: DC

## 2018-06-22 NOTE — Progress Notes (Addendum)
Subjective:     Patient ID: Nicholas Willis, male   DOB: 1970/10/24, 47 y.o.   MRN: [id number]    HPI  Patient is a 47 year old male in no acute distress who comes to the clinic for nasal/ maxillary  sinus pressure congestion x 10 days. Congestion in head per patient. Sinus pressure worse with bending over.  Denies any known exposures.   Patient  denies any fever, body aches, chills, rash, chest pain, shortness of breath, nausea, vomiting, or diarrhea.    No Known Allergies   He also requests refill on Dexilant for reflux- controlled  and referral to [facility] as he says he can not get in with his current GI MD for months and wants a different provider. He reports he has two tablets left and a history of erosive gastritis/possible Barrett's  esophagus.      Review of Systems  Constitutional: Positive for fatigue (mild ). Negative for activity change, appetite change, chills, diaphoresis, fever and unexpected weight change.  HENT: Positive for congestion, ear pain, postnasal drip, sinus pressure, sneezing and sore throat. Negative for dental problem, drooling, ear discharge, facial swelling, hearing loss, mouth sores, nosebleeds, rhinorrhea, sinus pain, tinnitus, trouble swallowing and voice change.   Eyes: Negative.   Respiratory: Positive for cough. Negative for apnea, choking, chest tightness, shortness of breath, wheezing and stridor.   Cardiovascular: Negative.   Gastrointestinal: Negative.        Controlled reflux with medication he reports was seen and ruled out Barret's esophagus per patient denies erosive gastritis.  - has seen Gastrointestinal( last seen date patient is unknown)  Denies any bleeding from mouth/ throat/ rectum or in stools. No tarry stools.   Endocrine: Negative.   Genitourinary: Negative.   Musculoskeletal: Negative.   Skin: Negative.   Allergic/Immunologic: Negative.   Neurological: Negative.   Hematological: Negative.    Psychiatric/Behavioral: Negative.        Objective:   Physical Exam  Constitutional: He is oriented to person, place, and time. He appears well-developed and well-nourished. He is active.  Non-toxic appearance. He does not have a sickly appearance. He does not appear ill. No distress.  HENT:  Head: Normocephalic and atraumatic.  Right Ear: Hearing, external ear and ear canal normal. Tympanic membrane is bulging (moderate bilatearlly ). Tympanic membrane is not retracted. A middle ear effusion is present.  Left Ear: Hearing, external ear and ear canal normal. Tympanic membrane is bulging. Tympanic membrane is not retracted. A middle ear effusion is present.  Nose: Nose normal.  Mouth/Throat: Oropharynx is clear and moist. No oropharyngeal exudate.  Eyes: Pupils are equal, round, and reactive to light. Conjunctivae and EOM are normal. Right eye exhibits no discharge. Left eye exhibits no discharge. No scleral icterus.  Neck: Normal range of motion. Neck supple. No JVD present. No tracheal deviation present.  Cardiovascular: Normal rate, regular rhythm, normal heart sounds and intact distal pulses. Exam reveals no gallop and no friction rub.  No murmur heard. Pulmonary/Chest: Effort normal and breath sounds normal. No stridor. No respiratory distress. He has no wheezes. He has no rales. He exhibits no tenderness.  Abdominal: Soft. Bowel sounds are normal. He exhibits no distension and no mass. There is no tenderness. There is no rebound and no guarding. No hernia.  Musculoskeletal: Normal range of motion.  Lymphadenopathy:    He has no cervical adenopathy.  Neurological: He is alert and oriented to person, place, and time. He displays normal reflexes. No  cranial nerve deficit. He exhibits normal muscle tone. Coordination normal.  Skin: Skin is warm and dry. Capillary refill takes less than 2 seconds. No rash noted. He is not diaphoretic. No erythema. No pallor.  Psychiatric: He has a normal  mood and affect. His behavior is normal. Judgment and thought content normal.  Vitals reviewed.      Assessment:     Acute non-recurrent maxillary sinusitis      Plan:     Meds ordered this encounter  Medications  . amoxicillin-clavulanate (AUGMENTIN) 875-125 MG tablet    Sig: Take 1 tablet by mouth 2 (two) times daily.    Dispense:  20 tablet    Refill:  0  . predniSONE (STERAPRED UNI-PAK 21 TAB) 10 MG (21) TBPK tablet    Sig: PO: Take 6 tablets on day 1:Take 5 tablets day 2:Take 4 tablets day 3: Take 3 tablets day 4:Take 2 tablets day five: 5 Take 1 tablet day 6    Dispense:  21 tablet    Refill:  0  . cetirizine (ZYRTEC) 10 MG tablet    Sig: Take 1 tablet (10 mg total) by mouth daily.    Dispense:  30 tablet    Refill:  2  . fluticasone (FLONASE) 50 MCG/ACT nasal spray    Sig: Place 2 sprays into both nostrils daily.    Dispense:  16 g    Refill:  6  . dexlansoprazole (DEXILANT) 60 MG capsule    Sig: Take 1 capsule (60 mg total) by mouth daily.    Dispense:  30 capsule    Refill:  0   Patient understands  this is bridge only for Dexilant for thirty days will be refilled he must see specialist. He verbalized understanding.   Orders Placed This Encounter  Procedures  . Ambulatory referral to Gastroenterology    Referral Priority:   Urgent    Referral Type:   Consultation    Referral Reason:   Specialty Services Required    Referred to Provider:   Lucilla Lame, MD    Number of Visits Requested:   1   He denies any history of gastric bleeds or kidney disease and will not take and antiinflamatorrys NSAIDS suchg as motrin advil ibuprofen containing products for 10 days after prednisone or while taking.     Provider thoroughly discussed in collaboration above plan with supervising physician Dr. Miguel Aschoff who is in agreement with the care plan as above.    Advised patient call the office or your primary care doctor for an appointment if no improvement within 72  hours or if any symptoms change or worsen at any time  Advised ER or urgent Care if after hours or on weekend. Call 911 for emergency symptoms at any time.Patinet verbalized understanding of all instructions given/reviewed and treatment plan and has no further questions or concerns at this time.    Patient verbalized understanding of all instructions given and denies any further questions at this time.

## 2018-06-22 NOTE — Patient Instructions (Signed)
Prednisone tablets What is this medicine? PREDNISONE (PRED ni sone) is a corticosteroid. It is commonly used to treat inflammation of the skin, joints, lungs, and other organs. Common conditions treated include asthma, allergies, and arthritis. It is also used for other conditions, such as blood disorders and diseases of the adrenal glands. This medicine may be used for other purposes; ask your health care provider or pharmacist if you have questions. COMMON BRAND NAME(S): Deltasone, Predone, Sterapred, Sterapred DS What should I tell my health care provider before I take this medicine? They need to know if you have any of these conditions: -Cushing's syndrome -diabetes -glaucoma -heart disease -high blood pressure -infection (especially a virus infection such as chickenpox, cold sores, or herpes) -kidney disease -liver disease -mental illness -myasthenia gravis -osteoporosis -seizures -stomach or intestine problems -thyroid disease -an unusual or allergic reaction to lactose, prednisone, other medicines, foods, dyes, or preservatives -pregnant or trying to get pregnant -breast-feeding How should I use this medicine? Take this medicine by mouth with a glass of water. Follow the directions on the prescription label. Take this medicine with food. If you are taking this medicine once a day, take it in the morning. Do not take more medicine than you are told to take. Do not suddenly stop taking your medicine because you may develop a severe reaction. Your doctor will tell you how much medicine to take. If your doctor wants you to stop the medicine, the dose may be slowly lowered over time to avoid any side effects. Talk to your pediatrician regarding the use of this medicine in children. Special care may be needed. Overdosage: If you think you have taken too much of this medicine contact a poison control center or emergency room at once. NOTE: This medicine is only for you. Do not share this  medicine with others. What if I miss a dose? If you miss a dose, take it as soon as you can. If it is almost time for your next dose, talk to your doctor or health care professional. You may need to miss a dose or take an extra dose. Do not take double or extra doses without advice. What may interact with this medicine? Do not take this medicine with any of the following medications: -metyrapone -mifepristone This medicine may also interact with the following medications: -aminoglutethimide -amphotericin B -aspirin and aspirin-like medicines -barbiturates -certain medicines for diabetes, like glipizide or glyburide -cholestyramine -cholinesterase inhibitors -cyclosporine -digoxin -diuretics -ephedrine -male hormones, like estrogens and birth control pills -isoniazid -ketoconazole -NSAIDS, medicines for pain and inflammation, like ibuprofen or naproxen -phenytoin -rifampin -toxoids -vaccines -warfarin This list may not describe all possible interactions. Give your health care provider a list of all the medicines, herbs, non-prescription drugs, or dietary supplements you use. Also tell them if you smoke, drink alcohol, or use illegal drugs. Some items may interact with your medicine. What should I watch for while using this medicine? Visit your doctor or health care professional for regular checks on your progress. If you are taking this medicine over a prolonged period, carry an identification card with your name and address, the type and dose of your medicine, and your doctor's name and address. This medicine may increase your risk of getting an infection. Tell your doctor or health care professional if you are around anyone with measles or chickenpox, or if you develop sores or blisters that do not heal properly. If you are going to have surgery, tell your doctor or health care professional that  you have taken this medicine within the last twelve months. Ask your doctor or health  care professional about your diet. You may need to lower the amount of salt you eat. This medicine may affect blood sugar levels. If you have diabetes, check with your doctor or health care professional before you change your diet or the dose of your diabetic medicine. What side effects may I notice from receiving this medicine? Side effects that you should report to your doctor or health care professional as soon as possible: -allergic reactions like skin rash, itching or hives, swelling of the face, lips, or tongue -changes in emotions or moods -changes in vision -depressed mood -eye pain -fever or chills, cough, sore throat, pain or difficulty passing urine -increased thirst -swelling of ankles, feet Side effects that usually do not require medical attention (report to your doctor or health care professional if they continue or are bothersome): -confusion, excitement, restlessness -headache -nausea, vomiting -skin problems, acne, thin and shiny skin -trouble sleeping -weight gain This list may not describe all possible side effects. Call your doctor for medical advice about side effects. You may report side effects to FDA at 1-800-FDA-1088. Where should I keep my medicine? Keep out of the reach of children. Store at room temperature between 15 and 30 degrees C (59 and 86 degrees F). Protect from light. Keep container tightly closed. Throw away any unused medicine after the expiration date. NOTE: This sheet is a summary. It may not cover all possible information. If you have questions about this medicine, talk to your doctor, pharmacist, or health care provider.  2018 Elsevier/Gold Standard (2011-03-10 10:57:14) Sinusitis, Adult Sinusitis is soreness and inflammation of your sinuses. Sinuses are hollow spaces in the bones around your face. They are located:  Around your eyes.  In the middle of your forehead.  Behind your nose.  In your cheekbones.  Your sinuses and nasal  passages are lined with a stringy fluid (mucus). Mucus normally drains out of your sinuses. When your nasal tissues get inflamed or swollen, the mucus can get trapped or blocked so air cannot flow through your sinuses. This lets bacteria, viruses, and funguses grow, and that leads to infection. Follow these instructions at home: Medicines  Take, use, or apply over-the-counter and prescription medicines only as told by your doctor. These may include nasal sprays.  If you were prescribed an antibiotic medicine, take it as told by your doctor. Do not stop taking the antibiotic even if you start to feel better. Hydrate and Humidify  Drink enough water to keep your pee (urine) clear or pale yellow.  Use a cool mist humidifier to keep the humidity level in your home above 50%.  Breathe in steam for 10-15 minutes, 3-4 times a day or as told by your doctor. You can do this in the bathroom while a hot shower is running.  Try not to spend time in cool or dry air. Rest  Rest as much as possible.  Sleep with your head raised (elevated).  Make sure to get enough sleep each night. General instructions  Put a warm, moist washcloth on your face 3-4 times a day or as told by your doctor. This will help with discomfort.  Wash your hands often with soap and water. If there is no soap and water, use hand sanitizer.  Do not smoke. Avoid being around people who are smoking (secondhand smoke).  Keep all follow-up visits as told by your doctor. This is important. Contact a  doctor if:  You have a fever.  Your symptoms get worse.  Your symptoms do not get better within 10 days. Get help right away if:  You have a very bad headache.  You cannot stop throwing up (vomiting).  You have pain or swelling around your face or eyes.  You have trouble seeing.  You feel confused.  Your neck is stiff.  You have trouble breathing. This information is not intended to replace advice given to you by your  health care provider. Make sure you discuss any questions you have with your health care provider. Document Released: 01/11/2008 Document Revised: 03/20/2016 Document Reviewed: 05/20/2015 Elsevier Interactive Patient Education  2018 [place]. Amoxicillin; Clavulanic Acid tablets What is this medicine? AMOXICILLIN; CLAVULANIC ACID (a mox i SIL in; KLAV yoo lan ic AS id) is a penicillin antibiotic. It is used to treat certain kinds of bacterial infections. It will not work for colds, flu, or other viral infections. This medicine may be used for other purposes; ask your health care provider or pharmacist if you have questions. COMMON BRAND NAME(S): Augmentin What should I tell my health care provider before I take this medicine? They need to know if you have any of these conditions: -bowel disease, like colitis -kidney disease -liver disease -mononucleosis -an unusual or allergic reaction to amoxicillin, penicillin, cephalosporin, other antibiotics, clavulanic acid, other medicines, foods, dyes, or preservatives -pregnant or trying to get pregnant -breast-feeding How should I use this medicine? Take this medicine by mouth with a full glass of water. Follow the directions on the prescription label. Take at the start of a meal. Do not crush or chew. If the tablet has a score line, you may cut it in half at the score line for easier swallowing. Take your medicine at regular intervals. Do not take your medicine more often than directed. Take all of your medicine as directed even if you think you are better. Do not skip doses or stop your medicine early. Talk to your pediatrician regarding the use of this medicine in children. Special care may be needed. Overdosage: If you think you have taken too much of this medicine contact a poison control center or emergency room at once. NOTE: This medicine is only for you. Do not share this medicine with others. What if I miss a dose? If you miss a dose,  take it as soon as you can. If it is almost time for your next dose, take only that dose. Do not take double or extra doses. What may interact with this medicine? -allopurinol -anticoagulants -birth control pills -methotrexate -probenecid This list may not describe all possible interactions. Give your health care provider a list of all the medicines, herbs, non-prescription drugs, or dietary supplements you use. Also tell them if you smoke, drink alcohol, or use illegal drugs. Some items may interact with your medicine. What should I watch for while using this medicine? Tell your doctor or health care professional if your symptoms do not improve. Do not treat diarrhea with over the counter products. Contact your doctor if you have diarrhea that lasts more than 2 days or if it is severe and watery. If you have diabetes, you may get a false-positive result for sugar in your urine. Check with your doctor or health care professional. Birth control pills may not work properly while you are taking this medicine. Talk to your doctor about using an extra method of birth control. What side effects may I notice from receiving this  medicine? Side effects that you should report to your doctor or health care professional as soon as possible: -allergic reactions like skin rash, itching or hives, swelling of the face, lips, or tongue -breathing problems -dark urine -fever or chills, sore throat -redness, blistering, peeling or loosening of the skin, including inside the mouth -seizures -trouble passing urine or change in the amount of urine -unusual bleeding, bruising -unusually weak or tired -white patches or sores in the mouth or throat Side effects that usually do not require medical attention (report to your doctor or health care professional if they continue or are bothersome): -diarrhea -dizziness -headache -nausea, vomiting -stomach upset -vaginal or anal irritation This list may not describe  all possible side effects. Call your doctor for medical advice about side effects. You may report side effects to FDA at 1-800-FDA-1088. Where should I keep my medicine? Keep out of the reach of children. Store at room temperature below 25 degrees C (77 degrees F). Keep container tightly closed. Throw away any unused medicine after the expiration date. NOTE: This sheet is a summary. It may not cover all possible information. If you have questions about this medicine, talk to your doctor, pharmacist, or health care provider.  2018 Elsevier/Gold Standard (2007-10-18 12:04:30)

## 2018-06-28 ENCOUNTER — Ambulatory Visit: Payer: BLUE CROSS/BLUE SHIELD | Admitting: Gastroenterology

## 2018-06-28 ENCOUNTER — Encounter: Payer: Self-pay | Admitting: *Deleted

## 2018-06-28 NOTE — Progress Notes (Deleted)
Gastroenterology Consultation  Referring Provider:     Sharmon Leyden* Primary Care Physician:  Sofie Hartigan, MD Primary Gastroenterologist:  Dr. Allen Norris     Reason for Consultation:     Barrett's esophagus and heartburn         HPI:   Nicholas Willis is a 47 y.o. y/o male referred for consultation & management of Barrett's esophagus and heartburn by Dr. Ellison Hughs, Chrissie Noa, MD.  This patient comes in today with a history of Barrett's esophagus with Barrett's esophagus found on an upper endoscopy in 2014.  The patient had a repeat upper endoscopy in 2017 without any signs of Barrett's esophagus.  The patient had ran out of his Dexilant and had been seen at urgent care on the 15th of this month.  The patient had previously gotten a refill of his medication at the [place] [facility] on 28 October.  The patient had reported an urgent care that he was unable to see his primary gastroneurologist in a reasonable amount of time so he wanted to switch to another gastroneurologist and now comes to see me today.  The patient was in urgent care for sinus problems but had reported to them that he was in need of a refill of his Dexilant.  He reports that his symptoms are controlled by the [facility].  Past Medical History:  Diagnosis Date  . GERD (gastroesophageal reflux disease)   . Rosacea     Past Surgical History:  Procedure Laterality Date  . ADENOIDECTOMY      Prior to Admission medications   Medication Sig Start Date End Date Taking? Authorizing Provider  acyclovir (ZOVIRAX) 200 MG capsule TAKE 1 CAPSULE (200 MG TOTAL) BY MOUTH 5 (FIVE) TIMES DAILY. 05/19/15   [provider]  amoxicillin-clavulanate (AUGMENTIN) 875-125 MG tablet Take 1 tablet by mouth 2 (two) times daily. 06/22/18   Flinchum, Kelby Aline, FNP  cetirizine (ZYRTEC) 10 MG tablet Take 1 tablet (10 mg total) by mouth daily. 06/22/18   Flinchum, Kelby Aline, FNP  dexlansoprazole (DEXILANT) 60 MG capsule Take 1  capsule (60 mg total) by mouth daily. 06/22/18   Flinchum, Kelby Aline, FNP  fluticasone (FLONASE) 50 MCG/ACT nasal spray Place 2 sprays into both nostrils daily. 06/22/18   Flinchum, Kelby Aline, FNP  hydrochlorothiazide (HYDRODIURIL) 12.5 MG tablet Take 12.5 mg by mouth daily.    [provider]  losartan (COZAAR) 50 MG tablet Take 50 mg by mouth daily.    [provider]  metoprolol succinate (TOPROL-XL) 25 MG 24 hr tablet TAKE 2 TABLETS (50 MG TOTAL) BY MOUTH DAILY AT 0600. 11/03/17   [provider]  predniSONE (STERAPRED UNI-PAK 21 TAB) 10 MG (21) TBPK tablet PO: Take 6 tablets on day 1:Take 5 tablets day 2:Take 4 tablets day 3: Take 3 tablets day 4:Take 2 tablets day five: 5 Take 1 tablet day 6 06/22/18   Flinchum, Kelby Aline, FNP  zolpidem (AMBIEN CR) 6.25 MG CR tablet Take 1 tablet (6.25 mg total) by mouth at bedtime as needed for sleep. 05/03/18   Kendell Bane, NP    Family History  Problem Relation Age of Onset  . Hypertension Mother   . GER disease Father      Social History   Tobacco Use  . Smoking status: Never Smoker  . Smokeless tobacco: Never Used  Substance Use Topics  . Alcohol use: Yes    Comment: social  . Drug use: No  Allergies as of 06/28/2018  . (No Known Allergies)    Review of Systems:    All systems reviewed and negative except where noted in HPI.   Physical Exam:  There were no vitals taken for this visit. No LMP for male patient. General:   Alert,  Well-developed, well-nourished, pleasant and cooperative in NAD Head:  Normocephalic and atraumatic. Eyes:  Sclera clear, no icterus.   Conjunctiva pink. Ears:  Normal auditory acuity. Nose:  No deformity, discharge, or lesions. Mouth:  No deformity or lesions,oropharynx pink & moist. Neck:  Supple; no masses or thyromegaly. Lungs:  Respirations even and unlabored.  Clear throughout to auscultation.   No wheezes, crackles, or rhonchi. No acute distress. Heart:  Regular  rate and rhythm; no murmurs, clicks, rubs, or gallops. Abdomen:  Normal bowel sounds.  No bruits.  Soft, non-tender and non-distended without masses, hepatosplenomegaly or hernias noted.  No guarding or rebound tenderness.  Negative Carnett sign.   Rectal:  Deferred.  Msk:  Symmetrical without gross deformities.  Good, equal movement & strength bilaterally. Pulses:  Normal pulses noted. Extremities:  No clubbing or edema.  No cyanosis. Neurologic:  Alert and oriented x3;  grossly normal neurologically. Skin:  Intact without significant lesions or rashes.  No jaundice. Lymph Nodes:  No significant cervical adenopathy. Psych:  Alert and cooperative. Normal mood and affect.  Imaging Studies: No results found.  Assessment and Plan:   Nicholas Willis is a 47 y.o. y/o male ***  Lucilla Lame, MD. Marval Regal    Note: This dictation was prepared with Dragon dictation along with smaller phrase technology. Any transcriptional errors that result from this process are unintentional.

## 2018-06-29 NOTE — Progress Notes (Signed)
My chart message sent to patient as well and his was instructed to see gastric MD

## 2018-07-25 ENCOUNTER — Other Ambulatory Visit: Payer: Self-pay | Admitting: Adult Health

## 2018-08-06 ENCOUNTER — Ambulatory Visit: Payer: BLUE CROSS/BLUE SHIELD | Admitting: Gastroenterology

## 2018-08-06 ENCOUNTER — Encounter: Payer: Self-pay | Admitting: Gastroenterology

## 2018-08-06 VITALS — BP 136/82 | HR 97 | Ht 73.0 in | Wt 188.6 lb

## 2018-08-06 DIAGNOSIS — K219 Gastro-esophageal reflux disease without esophagitis: Secondary | ICD-10-CM

## 2018-08-06 MED ORDER — DEXLANSOPRAZOLE 30 MG PO CPDR: 30.0000 mg | DELAYED_RELEASE_CAPSULE | Freq: Every day | ORAL | 1 refills | Status: DC

## 2018-08-06 NOTE — Progress Notes (Signed)
Gastroenterology Consultation  Referring Provider:     Sharmon Leyden* Primary Care Physician:  Sofie Hartigan, MD Primary Gastroenterologist:  Dr. Allen Norris     Reason for Consultation:     Barrett's esophagus        HPI:   Nicholas Willis is a 47 y.o. y/o male referred for consultation & management of Barrett's esophagus by Dr. Ellison Hughs, Chrissie Noa, MD.  This patient comes in today after being seen in the past by Dr. Gustavo Lah at [facility] and by Dr. Lacey Jensen at [facility]  The patient has had multiple EGDs in the past and a report of an irregular Z line.  The patient had biopsies that have never shown any sign of Barrett's esophagus.  The patient also underwent a esophageal manometry which showed: "Esoph manometry with elevated pressure at top of esophagus,weak pressure thru esophagus and poor liquid transit". The patient had reported to his last Gastroenterologist that the Dexilant was helping his symptoms. The patient also reports that he has been asymptomatic recently and states that he actually ran out of his medication for about 5 days and had no symptoms off the medication.  He is also wondering if he should continue to be on the medication.  There is no report of any dysphagia black stools bloody stools or abdominal pain.  The patient reports that when he had the worst of his symptoms he was under a lot of stress but reports he does not have any stress now.   Past Medical History:  Diagnosis Date  . GERD (gastroesophageal reflux disease)   . Rosacea     Past Surgical History:  Procedure Laterality Date  . ADENOIDECTOMY      Prior to Admission medications   Medication Sig Start Date End Date Taking? Authorizing Provider  acyclovir (ZOVIRAX) 200 MG capsule TAKE 1 CAPSULE (200 MG TOTAL) BY MOUTH 5 (FIVE) TIMES DAILY. 05/19/15   [provider]  amoxicillin-clavulanate (AUGMENTIN) 875-125 MG tablet Take 1 tablet by mouth 2 (two) times daily. 06/22/18    Flinchum, Kelby Aline, FNP  cetirizine (ZYRTEC) 10 MG tablet Take 1 tablet (10 mg total) by mouth daily. 06/22/18   Flinchum, Kelby Aline, FNP  dexlansoprazole (DEXILANT) 60 MG capsule Take 1 capsule (60 mg total) by mouth daily. 06/22/18   Flinchum, Kelby Aline, FNP  fluticasone (FLONASE) 50 MCG/ACT nasal spray Place 2 sprays into both nostrils daily. 06/22/18   Flinchum, Kelby Aline, FNP  hydrochlorothiazide (HYDRODIURIL) 12.5 MG tablet Take 12.5 mg by mouth daily.    [provider]  losartan (COZAAR) 50 MG tablet Take 50 mg by mouth daily.    [provider]  metoprolol succinate (TOPROL-XL) 25 MG 24 hr tablet TAKE 2 TABLETS (50 MG TOTAL) BY MOUTH DAILY AT 0600. 11/03/17   [provider]  predniSONE (STERAPRED UNI-PAK 21 TAB) 10 MG (21) TBPK tablet PO: Take 6 tablets on day 1:Take 5 tablets day 2:Take 4 tablets day 3: Take 3 tablets day 4:Take 2 tablets day five: 5 Take 1 tablet day 6 06/22/18   Flinchum, Kelby Aline, FNP  zolpidem (AMBIEN CR) 6.25 MG CR tablet Take 1 tablet (6.25 mg total) by mouth at bedtime as needed for sleep. 05/03/18   Kendell Bane, NP    Family History  Problem Relation Age of Onset  . Hypertension Mother   . GER disease Father      Social History   Tobacco Use  . Smoking status: Never  Smoker  . Smokeless tobacco: Never Used  Substance Use Topics  . Alcohol use: Yes    Comment: social  . Drug use: No    Allergies as of 08/06/2018  . (No Known Allergies)    Review of Systems:    All systems reviewed and negative except where noted in HPI.   Physical Exam:  There were no vitals taken for this visit. No LMP for male patient. General:   Alert,  Well-developed, well-nourished, pleasant and cooperative in NAD Head:  Normocephalic and atraumatic. Eyes:  Sclera clear, no icterus.   Conjunctiva pink. Ears:  Normal auditory acuity. Nose:  No deformity, discharge, or lesions. Mouth:  No deformity or lesions,oropharynx pink &  moist. Neck:  Supple; no masses or thyromegaly. Lungs:  Respirations even and unlabored.  Clear throughout to auscultation.   No wheezes, crackles, or rhonchi. No acute distress. Heart:  Regular rate and rhythm; no murmurs, clicks, rubs, or gallops. Abdomen:  Normal bowel sounds.  No bruits.  Soft, non-tender and non-distended without masses, hepatosplenomegaly or hernias noted.  No guarding or rebound tenderness.  Negative Carnett sign.   Rectal:  Deferred.  Msk:  Symmetrical without gross deformities.  Good, equal movement & strength bilaterally. Pulses:  Normal pulses noted. Extremities:  No clubbing or edema.  No cyanosis. Neurologic:  Alert and oriented x3;  grossly normal neurologically. Skin:  Intact without significant lesions or rashes.  No jaundice. Lymph Nodes:  No significant cervical adenopathy. Psych:  Alert and cooperative. Normal mood and affect.  Imaging Studies: No results found.  Assessment and Plan:   Nicholas Willis is a 47 y.o. y/o male who has a history of reflux and had recently stopped the medication for 5 days without any resulting heartburn. The patient would like to try and go on a lower dose of the medication and will be sent in a prescription for 30 mg of Dexilant.  He states he has tried other medications that have not helped in the past.  He is also been told that if his medication works and he is symptom free he may consider coming off the medication completely to see if he needs to be on a PPI at all.  The patient states he understands the plan and agrees with it.  Lucilla Lame, MD. Marval Regal    Note: This dictation was prepared with Dragon dictation along with smaller phrase technology. Any transcriptional errors that result from this process are unintentional.

## 2018-08-30 ENCOUNTER — Other Ambulatory Visit: Payer: Self-pay | Admitting: Gastroenterology

## 2018-09-18 ENCOUNTER — Other Ambulatory Visit: Payer: Self-pay

## 2018-09-18 DIAGNOSIS — Z125 Encounter for screening for malignant neoplasm of prostate: Secondary | ICD-10-CM

## 2018-09-18 DIAGNOSIS — Z0000 Encounter for general adult medical examination without abnormal findings: Secondary | ICD-10-CM

## 2018-09-19 LAB — COMPREHENSIVE METABOLIC PANEL
ALT: 37 IU/L (ref 0–44)
AST: 29 IU/L (ref 0–40)
Albumin/Globulin Ratio: 2.5 — ABNORMAL HIGH (ref 1.2–2.2)
Albumin: 4.7 g/dL (ref 4.0–5.0)
Alkaline Phosphatase: 45 IU/L (ref 39–117)
BUN/Creatinine Ratio: 11 (ref 9–20)
BUN: 11 mg/dL (ref 6–24)
Bilirubin Total: 1.3 mg/dL — ABNORMAL HIGH (ref 0.0–1.2)
CO2: 24 mmol/L (ref 20–29)
Calcium: 9.6 mg/dL (ref 8.7–10.2)
Chloride: 100 mmol/L (ref 96–106)
Creatinine, Ser: 0.98 mg/dL (ref 0.76–1.27)
GFR calc Af Amer: 105 mL/min/{1.73_m2} (ref >59)
GFR calc non Af Amer: 91 mL/min/{1.73_m2} (ref >59)
Globulin, Total: 1.9 g/dL (ref 1.5–4.5)
Glucose: 99 mg/dL (ref 65–99)
Potassium: 3.9 mmol/L (ref 3.5–5.2)
Sodium: 140 mmol/L (ref 134–144)
Total Protein: 6.6 g/dL (ref 6.0–8.5)

## 2018-09-19 LAB — CBC WITH DIFFERENTIAL/PLATELET
Basophils Absolute: 0 10*3/uL (ref 0.0–0.2)
Basos: 1 %
EOS (ABSOLUTE): 0.1 10*3/uL (ref 0.0–0.4)
Eos: 3 %
Hematocrit: 44.1 % (ref 37.5–51.0)
Hemoglobin: 15.6 g/dL (ref 13.0–17.7)
Immature Grans (Abs): 0 10*3/uL (ref 0.0–0.1)
Immature Granulocytes: 0 %
Lymphocytes Absolute: 1.5 10*3/uL (ref 0.7–3.1)
Lymphs: 31 %
MCH: 32.4 pg (ref 26.6–33.0)
MCHC: 35.4 g/dL (ref 31.5–35.7)
MCV: 92 fL (ref 79–97)
Monocytes Absolute: 0.4 10*3/uL (ref 0.1–0.9)
Monocytes: 9 %
Neutrophils Absolute: 2.6 10*3/uL (ref 1.4–7.0)
Neutrophils: 56 %
Platelets: 187 10*3/uL (ref 150–450)
RBC: 4.81 x10E6/uL (ref 4.14–5.80)
RDW: 12.6 % (ref 11.6–15.4)
WBC: 4.7 10*3/uL (ref 3.4–10.8)

## 2018-09-19 LAB — THYROID PANEL
FREE THYROXINE INDEX: 1.5 (ref 1.2–4.9)
T3 Uptake Ratio: 26 % (ref 24–39)
T4, Total: 5.9 ug/dL (ref 4.5–12.0)

## 2018-09-19 LAB — LIPID PANEL
Chol/HDL Ratio: 2.9 ratio (ref 0.0–5.0)
Cholesterol, Total: 189 mg/dL (ref 100–199)
HDL: 66 mg/dL (ref >39)
LDL CALC: 112 mg/dL — AB (ref 0–99)
Triglycerides: 55 mg/dL (ref 0–149)
VLDL Cholesterol Cal: 11 mg/dL (ref 5–40)

## 2018-09-19 LAB — PSA: Prostate Specific Ag, Serum: 1.5 ng/mL (ref 0.0–4.0)

## 2018-10-03 ENCOUNTER — Ambulatory Visit: Payer: Self-pay

## 2018-10-09 ENCOUNTER — Telehealth: Payer: Self-pay | Admitting: Gastroenterology

## 2018-10-09 NOTE — Telephone Encounter (Signed)
Pt was told if he needed to go back on rx Dexalint 25 mg  to let Dr. Allen Norris know he would like it called in to [address]  For a 90 day supply.

## 2018-10-11 ENCOUNTER — Other Ambulatory Visit: Payer: Self-pay

## 2018-10-17 ENCOUNTER — Ambulatory Visit: Payer: BC Managed Care – PPO

## 2018-10-17 DIAGNOSIS — G4733 Obstructive sleep apnea (adult) (pediatric): Secondary | ICD-10-CM

## 2018-10-17 NOTE — Progress Notes (Signed)
95 percentile pressure 11   95th percentile leak 29.4   apnea index 4.1 /hr  apnea-hypopnea index  5.3 /hr   total days used  >4 hr 52 days  total days used <4 hr 7 days  Total compliance 58 percent  He is getting woke up during night startled possible from apnea, will try going to a pressure of 12, and get download in couple weeks.

## 2018-11-05 ENCOUNTER — Ambulatory Visit: Payer: Self-pay | Admitting: Internal Medicine

## 2019-04-30 ENCOUNTER — Encounter: Payer: Self-pay | Admitting: Internal Medicine

## 2019-04-30 ENCOUNTER — Ambulatory Visit: Payer: BC Managed Care – PPO | Admitting: Internal Medicine

## 2019-04-30 ENCOUNTER — Other Ambulatory Visit: Payer: Self-pay

## 2019-04-30 VITALS — BP 142/98 | HR 81 | Resp 16 | Ht 73.0 in | Wt 194.0 lb

## 2019-04-30 DIAGNOSIS — Z9989 Dependence on other enabling machines and devices: Secondary | ICD-10-CM | POA: Diagnosis not present

## 2019-04-30 DIAGNOSIS — R002 Palpitations: Secondary | ICD-10-CM | POA: Diagnosis not present

## 2019-04-30 DIAGNOSIS — K2100 Gastro-esophageal reflux disease with esophagitis, without bleeding: Secondary | ICD-10-CM

## 2019-04-30 DIAGNOSIS — G4733 Obstructive sleep apnea (adult) (pediatric): Secondary | ICD-10-CM | POA: Diagnosis not present

## 2019-04-30 NOTE — Patient Instructions (Signed)

## 2019-04-30 NOTE — Progress Notes (Signed)
[facility] [place], [place] [address]  Pulmonary Sleep Medicine   Office Visit Note  Patient Name: Nicholas Willis DOB: 17-Jul-1971 MRN [id number]  Date of Service: 04/30/2019  Complaints/HPI: Patient is here for follow-up of sleep apnea.  He states he is using the CPAP has about a 50% compliance on the latest download.  He does still have a index of 6.4/h.  I discussed sleep hygiene with him I also discussed the machine in depth with him.  We also discussed the possibility of doing an auto titration because it could be that he does not have enough pressure right now.  In the beginning he was not able to tolerate auto titration but he feels like now he can be his denies having any headaches no dizziness he does still have some snoring and he did have some witnessed apneas but has not seen the CPAP.  ROS  General: (-) fever, (-) chills, (-) night sweats, (-) weakness Skin: (-) rashes, (-) itching,. Eyes: (-) visual changes, (-) redness, (-) itching. Nose and Sinuses: (-) nasal stuffiness or itchiness, (-) postnasal drip, (-) nosebleeds, (-) sinus trouble. Mouth and Throat: (-) sore throat, (-) hoarseness. Neck: (-) swollen glands, (-) enlarged thyroid, (-) neck pain. Respiratory: - cough, (-) bloody sputum, - shortness of breath, - wheezing. Cardiovascular: - ankle swelling, (-) chest pain. Lymphatic: (-) lymph node enlargement. Neurologic: (-) numbness, (-) tingling. Psychiatric: (-) anxiety, (-) depression   Current Medication: Outpatient Encounter Medications as of 04/30/2019  Medication Sig  . hydrochlorothiazide (HYDRODIURIL) 12.5 MG tablet Take 12.5 mg by mouth daily.  Marland Kitchen losartan (COZAAR) 50 MG tablet Take 50 mg by mouth daily.  . metoprolol succinate (TOPROL-XL) 25 MG 24 hr tablet TAKE 2 TABLETS (50 MG TOTAL) BY MOUTH DAILY AT 0600.  . [DISCONTINUED] acyclovir (ZOVIRAX) 200 MG capsule TAKE 1 CAPSULE (200 MG TOTAL) BY MOUTH 5 (FIVE) TIMES DAILY.   . [DISCONTINUED] cetirizine (ZYRTEC) 10 MG tablet Take 1 tablet (10 mg total) by mouth daily. (Patient not taking: Reported on 08/06/2018)  . [DISCONTINUED] DEXILANT 30 MG capsule TAKE 1 CAPSULE BY MOUTH EVERY DAY (Patient not taking: Reported on 04/30/2019)  . [DISCONTINUED] dexlansoprazole (DEXILANT) 60 MG capsule Take 1 capsule (60 mg total) by mouth daily. (Patient not taking: Reported on 04/30/2019)  . [DISCONTINUED] fluticasone (FLONASE) 50 MCG/ACT nasal spray Place 2 sprays into both nostrils daily. (Patient not taking: Reported on 08/06/2018)  . [DISCONTINUED] zolpidem (AMBIEN CR) 6.25 MG CR tablet Take 1 tablet (6.25 mg total) by mouth at bedtime as needed for sleep. (Patient not taking: Reported on 08/06/2018)   No facility-administered encounter medications on file as of 04/30/2019.     Surgical History: Past Surgical History:  Procedure Laterality Date  . ADENOIDECTOMY      Medical History: Past Medical History:  Diagnosis Date  . GERD (gastroesophageal reflux disease)   . Rosacea     Family History: Family History  Problem Relation Age of Onset  . Hypertension Mother   . GER disease Father     Social History: Social History   Socioeconomic History  . Marital status: Married    Spouse name: Not on file  . Number of children: Not on file  . Years of education: Not on file  . Highest education level: Not on file  Occupational History  . Not on file  Social Needs  . Financial resource strain: Not on file  . Food insecurity    Worry: Not  on file    Inability: Not on file  . Transportation needs    Medical: Not on file    Non-medical: Not on file  Tobacco Use  . Smoking status: Never Smoker  . Smokeless tobacco: Never Used  Substance and Sexual Activity  . Alcohol use: Yes    Comment: social  . Drug use: No  . Sexual activity: Not on file  Lifestyle  . Physical activity    Days per week: Not on file    Minutes per session: Not on file  . Stress: Not  on file  Relationships  . Social Herbalist on phone: Not on file    Gets together: Not on file    Attends religious service: Not on file    Active member of club or organization: Not on file    Attends meetings of clubs or organizations: Not on file    Relationship status: Not on file  . Intimate partner violence    Fear of current or ex partner: Not on file    Emotionally abused: Not on file    Physically abused: Not on file    Forced sexual activity: Not on file  Other Topics Concern  . Not on file  Social History Narrative  . Not on file    Vital Signs: Blood pressure (!) 142/98, pulse 81, resp. rate 16, height 6\' 1"  (1.854 m), weight 194 lb (88 kg), SpO2 97 %.  Examination: General Appearance: The patient is well-developed, well-nourished, and in no distress. Skin: Gross inspection of skin unremarkable. Head: normocephalic, no gross deformities. Eyes: no gross deformities noted. ENT: ears appear grossly normal no exudates. Neck: Supple. No thyromegaly. No LAD. Respiratory: no rhonchi noted. Cardiovascular: Normal S1 and S2 without murmur or rub. Extremities: No cyanosis. pulses are equal. Neurologic: Alert and oriented. No involuntary movements.  LABS: No results found for this or any previous visit (from the past 2160 hour(s)).  Radiology: No results found.  No results found.  No results found.    Assessment and Plan: Patient Active Problem List   Diagnosis Date Noted  . OSA on CPAP 12/14/2017  . Ascending aorta dilatation (HCC) 04/02/2017  . Palpitations 03/28/2017  . Anxiety 02/25/2017  . Chest pain 02/25/2017  . Essential hypertension 02/25/2017  . Gastroesophageal reflux disease 02/25/2017  . Barrett's esophagus 09/19/2013  . Epigastric pain 03/21/2013    1. OSA we will switch him over to an auto titrating device his mask appears to be adequate.  We will switch him over to a 5-15 pressure auto titrating device hopefully he will be able  to better tolerate this now. 2. GERD needs to continue with current management PPIs I think this in part is contributing to some of his symptoms. 3. Palpitations this is been worked up by cardiology in the past.  He has been on metoprolol in the past which he was taking once a day.  He does also have a history of hypertension it may be feasible to consider placing him back on the metoprolol but using it 25 g twice daily rather than once a day.  He will discuss this with his primary care physician.  General Counseling: I have discussed the findings of the evaluation and examination with Nicholas Willis.  I have also discussed any further diagnostic evaluation thatmay be needed or ordered today. Nicholas Willis verbalizes understanding of the findings of todays visit. We also reviewed his medications today and discussed drug interactions and side effects including  but not limited excessive drowsiness and altered mental states. We also discussed that there is always a risk not just to him but also people around him. he has been encouraged to call the office with any questions or concerns that should arise related to todays visit.    Time spent: 45min  I have personally obtained a history, examined the patient, evaluated laboratory and imaging results, formulated the assessment and plan and placed orders.    Allyne Gee, MD [facility] Pulmonary and Critical Care Sleep medicine

## 2019-05-02 DIAGNOSIS — G479 Sleep disorder, unspecified: Secondary | ICD-10-CM | POA: Insufficient documentation

## 2019-05-08 ENCOUNTER — Ambulatory Visit: Payer: BC Managed Care – PPO

## 2019-05-15 ENCOUNTER — Ambulatory Visit: Payer: Self-pay

## 2019-05-15 ENCOUNTER — Other Ambulatory Visit: Payer: Self-pay

## 2019-05-15 DIAGNOSIS — Z23 Encounter for immunization: Secondary | ICD-10-CM

## 2019-06-06 ENCOUNTER — Ambulatory Visit: Payer: BC Managed Care – PPO | Admitting: Internal Medicine

## 2019-06-10 ENCOUNTER — Ambulatory Visit: Payer: BC Managed Care – PPO | Admitting: Internal Medicine

## 2019-06-24 ENCOUNTER — Other Ambulatory Visit: Payer: Self-pay

## 2019-06-24 DIAGNOSIS — Z20822 Contact with and (suspected) exposure to covid-19: Secondary | ICD-10-CM

## 2019-06-25 LAB — NOVEL CORONAVIRUS, NAA: SARS-CoV-2, NAA: NOT DETECTED

## 2019-06-25 LAB — INPATIENT

## 2019-06-26 ENCOUNTER — Telehealth: Payer: Self-pay

## 2019-06-26 NOTE — Telephone Encounter (Signed)
Confirmed pt appt for 06/27/19. Beth

## 2019-06-27 ENCOUNTER — Ambulatory Visit: Payer: BC Managed Care – PPO | Admitting: Internal Medicine

## 2019-06-27 ENCOUNTER — Encounter: Payer: Self-pay | Admitting: Internal Medicine

## 2019-06-27 ENCOUNTER — Other Ambulatory Visit: Payer: Self-pay

## 2019-06-27 DIAGNOSIS — G4733 Obstructive sleep apnea (adult) (pediatric): Secondary | ICD-10-CM

## 2019-06-27 DIAGNOSIS — Z9989 Dependence on other enabling machines and devices: Secondary | ICD-10-CM

## 2019-06-27 DIAGNOSIS — K219 Gastro-esophageal reflux disease without esophagitis: Secondary | ICD-10-CM | POA: Diagnosis not present

## 2019-06-27 DIAGNOSIS — G4700 Insomnia, unspecified: Secondary | ICD-10-CM

## 2019-06-27 DIAGNOSIS — I10 Essential (primary) hypertension: Secondary | ICD-10-CM | POA: Diagnosis not present

## 2019-06-27 NOTE — Progress Notes (Signed)
[facility] [place], [place] [address]  Internal MEDICINE  Telephone Visit  Patient Name: Nicholas Willis  [id number]  [id number]  Date of Service: 06/27/2019  I connected with the patient at 1203 by telephone and verified the patients identity using two identifiers.   I discussed the limitations, risks, security and privacy concerns of performing an evaluation and management service by telephone and the availability of in person appointments. I also discussed with the patient that there may be a patient responsible charge related to the service.  The patient expressed understanding and agrees to proceed.    Chief Complaint  Patient presents with  . Sleep Apnea    cpap     HPI  Pt is seen via video for follow up on auto titration.  He has been on it for about 3 weeks. He has been placed on small dose of trazodone by pcp and he is sleeping well. He is asking if his machine should be switched back to a set pressure.  Overall he is doing much better with the addition of trazodone by PCP.   Current Medication: Outpatient Encounter Medications as of 06/27/2019  Medication Sig  . hydrochlorothiazide (HYDRODIURIL) 12.5 MG tablet Take 12.5 mg by mouth daily.  Marland Kitchen losartan (COZAAR) 50 MG tablet Take 50 mg by mouth daily.  . metoprolol succinate (TOPROL-XL) 25 MG 24 hr tablet TAKE 2 TABLETS (50 MG TOTAL) BY MOUTH DAILY AT 0600.   No facility-administered encounter medications on file as of 06/27/2019.     Surgical History: Past Surgical History:  Procedure Laterality Date  . ADENOIDECTOMY      Medical History: Past Medical History:  Diagnosis Date  . GERD (gastroesophageal reflux disease)   . Rosacea     Family History: Family History  Problem Relation Age of Onset  . Hypertension Mother   . GER disease Father     Social History   Socioeconomic History  . Marital status: Married    Spouse name: Not on file  . Number of children: Not on file   . Years of education: Not on file  . Highest education level: Not on file  Occupational History  . Not on file  Social Needs  . Financial resource strain: Not on file  . Food insecurity    Worry: Not on file    Inability: Not on file  . Transportation needs    Medical: Not on file    Non-medical: Not on file  Tobacco Use  . Smoking status: Never Smoker  . Smokeless tobacco: Never Used  Substance and Sexual Activity  . Alcohol use: Yes    Comment: social  . Drug use: No  . Sexual activity: Not on file  Lifestyle  . Physical activity    Days per week: Not on file    Minutes per session: Not on file  . Stress: Not on file  Relationships  . Social Herbalist on phone: Not on file    Gets together: Not on file    Attends religious service: Not on file    Active member of club or organization: Not on file    Attends meetings of clubs or organizations: Not on file    Relationship status: Not on file  . Intimate partner violence    Fear of current or ex partner: Not on file    Emotionally abused: Not on file    Physically abused: Not on file  Forced sexual activity: Not on file  Other Topics Concern  . Not on file  Social History Narrative  . Not on file      Review of Systems  Constitutional: Negative.  Negative for chills, fatigue and unexpected weight change.  HENT: Negative.  Negative for congestion, rhinorrhea, sneezing and sore throat.   Eyes: Negative for redness.  Respiratory: Negative.  Negative for cough, chest tightness and shortness of breath.   Cardiovascular: Negative.  Negative for chest pain and palpitations.  Gastrointestinal: Negative.  Negative for abdominal pain, constipation, diarrhea, nausea and vomiting.  Endocrine: Negative.   Genitourinary: Negative.  Negative for dysuria and frequency.  Musculoskeletal: Negative.  Negative for arthralgias, back pain, joint swelling and neck pain.  Skin: Negative.  Negative for rash.   Allergic/Immunologic: Negative.   Neurological: Negative.  Negative for tremors and numbness.  Hematological: Negative for adenopathy. Does not bruise/bleed easily.  Psychiatric/Behavioral: Negative.  Negative for behavioral problems, sleep disturbance and suicidal ideas. The patient is not nervous/anxious.     Vital Signs: There were no vitals taken for this visit.   Observation/Objective:  Well appearing, NAD noted.   Assessment/Plan: 1. OSA on CPAP Will continue CPAP therapy with auto-titration as there is some concern with nocturnal trazodone increasing symptoms of osa. Continue to follow up as discussed.      2. Gastroesophageal reflux disease without esophagitis Stable, continue present management.  3. Insomnia, unspecified type Has improved with Trazodone, prescribed by PCP.     General Counseling: ory donisi understanding of the findings of today's phone visit and agrees with plan of treatment. I have discussed any further diagnostic evaluation that may be needed or ordered today. We also reviewed his medications today. he has been encouraged to call the office with any questions or concerns that should arise related to todays visit.    No orders of the defined types were placed in this encounter.   No orders of the defined types were placed in this encounter.   Time spent: [place] AGNP-C Internal medicine

## 2019-09-24 ENCOUNTER — Telehealth: Payer: Self-pay

## 2019-09-24 NOTE — Telephone Encounter (Signed)
Confirmed appointment on 09/26/2019 and screened for covid. klh 

## 2019-09-26 ENCOUNTER — Other Ambulatory Visit: Payer: Self-pay

## 2019-09-26 ENCOUNTER — Ambulatory Visit: Payer: BC Managed Care – PPO | Admitting: Internal Medicine

## 2019-09-26 ENCOUNTER — Encounter: Payer: Self-pay | Admitting: Internal Medicine

## 2019-10-01 ENCOUNTER — Telehealth: Payer: Self-pay

## 2019-10-01 NOTE — Telephone Encounter (Signed)
Called [facility]mom informing patient of appointment. klh 

## 2019-10-03 ENCOUNTER — Ambulatory Visit: Payer: BC Managed Care – PPO | Admitting: Internal Medicine

## 2019-10-03 ENCOUNTER — Telehealth: Payer: Self-pay

## 2019-10-03 NOTE — Telephone Encounter (Signed)
Patient rescheduled appointment on 10/03/2019 to 10/14/2019 due to patients work. Nicholas Willis

## 2019-10-10 ENCOUNTER — Telehealth: Payer: Self-pay

## 2019-10-10 NOTE — Telephone Encounter (Signed)
Called [facility]mom informing patient of appointment on 10/14/2019. klh

## 2019-10-14 ENCOUNTER — Other Ambulatory Visit: Payer: Self-pay

## 2019-10-14 ENCOUNTER — Encounter: Payer: Self-pay | Admitting: Internal Medicine

## 2019-10-14 ENCOUNTER — Ambulatory Visit (INDEPENDENT_AMBULATORY_CARE_PROVIDER_SITE_OTHER): Payer: BC Managed Care – PPO | Admitting: Internal Medicine

## 2019-10-14 VITALS — BP 136/96 | HR 82 | Temp 97.9°F | Resp 16 | Ht 73.0 in | Wt 193.0 lb

## 2019-10-14 DIAGNOSIS — F419 Anxiety disorder, unspecified: Secondary | ICD-10-CM

## 2019-10-14 DIAGNOSIS — K219 Gastro-esophageal reflux disease without esophagitis: Secondary | ICD-10-CM

## 2019-10-14 DIAGNOSIS — Z9989 Dependence on other enabling machines and devices: Secondary | ICD-10-CM | POA: Diagnosis not present

## 2019-10-14 DIAGNOSIS — G4733 Obstructive sleep apnea (adult) (pediatric): Secondary | ICD-10-CM

## 2019-10-14 NOTE — Patient Instructions (Signed)
Sleep Study  The sleep study consists of a recording of your brain waves (EEG). Breathing, heart rate and rhythm (ECG), oxygen level, eye movement, and leg movement.  The technician will glue or or paste several electrodes to your scalp, face, chest and legs.  You will have belts around your chest and abdomen to record breathing and a finger clasp to check blood oxygen levels.  A tube at your mouth and nose will detect airflow.  There are no needle sticks or painful procedures of any sort.  You will have your own room, and we will make every effort to attend to your comfort and privacy.  Please prepare for your study by the following steps:   Please avoid coffee, tea, soda, chocolate and other caffeine foods or beverages after 12:00 noon on the day of your sleep study.   You must arrive with clean (no oils), conditioners or make up, and please make sure that you wash your hair to ensure that your hair and scalp are clean, dry and free of any hair extensions on the day of your study.  This will help to get a good reading of study.  Please try not to nap on the day of your study.  Please bring a list of all your medications.  Bring any medications that you might need during the time you are within the laboratory, including insulin, sleeping pills, pain medication and anxiety medications.  Bring snacks, water or juice  Please bring clothes to sleep in and your normal overnight bag.  Please leave valuable at home, as we will not be responsible for any lost items.  If you have any further questions, please feel free to call our office. Thank you    

## 2019-10-14 NOTE — Progress Notes (Signed)
[facility] [place], [place] [address]  Pulmonary Sleep Medicine   Office Visit Note  Patient Name: Nicholas Willis DOB: 08/22/1970 MRN [id number]  Date of Service: 10/14/2019  Complaints/HPI: Patient states that he has been having some difficulty tolerating his CPAP device.  He feels almost as if the pressure is too much.  We have not had a follow-up assessment done in quite some time and I recommended that we reassess his OSA to make certain he still needs the sleep device at this point.  The patient is in agreement and what we will do is we will do a home study at this point to try to reassess his symptoms.  Discussed the process of doing a home study and he is in agreement at this time  ROS  General: (-) fever, (-) chills, (-) night sweats, (-) weakness Skin: (-) rashes, (-) itching,. Eyes: (-) visual changes, (-) redness, (-) itching. Nose and Sinuses: (-) nasal stuffiness or itchiness, (-) postnasal drip, (-) nosebleeds, (-) sinus trouble. Mouth and Throat: (-) sore throat, (-) hoarseness. Neck: (-) swollen glands, (-) enlarged thyroid, (-) neck pain. Respiratory: - cough, (-) bloody sputum, - shortness of breath, - wheezing. Cardiovascular: - ankle swelling, (-) chest pain. Lymphatic: (-) lymph node enlargement. Neurologic: (-) numbness, (-) tingling. Psychiatric: (-) anxiety, (-) depression   Current Medication: Outpatient Encounter Medications as of 10/14/2019  Medication Sig  . hydrochlorothiazide (HYDRODIURIL) 12.5 MG tablet Take 12.5 mg by mouth daily.  Marland Kitchen losartan (COZAAR) 50 MG tablet Take 50 mg by mouth daily.  . metoprolol succinate (TOPROL-XL) 25 MG 24 hr tablet TAKE 2 TABLETS (50 MG TOTAL) BY MOUTH DAILY AT 0600.   No facility-administered encounter medications on file as of 10/14/2019.    Surgical History: Past Surgical History:  Procedure Laterality Date  . ADENOIDECTOMY      Medical History: Past Medical History:   Diagnosis Date  . GERD (gastroesophageal reflux disease)   . Rosacea     Family History: Family History  Problem Relation Age of Onset  . Hypertension Mother   . GER disease Father     Social History: Social History   Socioeconomic History  . Marital status: Married    Spouse name: Not on file  . Number of children: Not on file  . Years of education: Not on file  . Highest education level: Not on file  Occupational History  . Not on file  Tobacco Use  . Smoking status: Never Smoker  . Smokeless tobacco: Never Used  Substance and Sexual Activity  . Alcohol use: Yes    Comment: social  . Drug use: No  . Sexual activity: Not on file  Other Topics Concern  . Not on file  Social History Narrative  . Not on file   Social Determinants of Health   Financial Resource Strain:   . Difficulty of Paying Living Expenses: Not on file  Food Insecurity:   . Worried About Charity fundraiser in the Last Year: Not on file  . Ran Out of Food in the Last Year: Not on file  Transportation Needs:   . Lack of Transportation (Medical): Not on file  . Lack of Transportation (Non-Medical): Not on file  Physical Activity:   . Days of Exercise per Week: Not on file  . Minutes of Exercise per Session: Not on file  Stress:   . Feeling of Stress : Not on file  Social Connections:   .  Frequency of Communication with Friends and Family: Not on file  . Frequency of Social Gatherings with Friends and Family: Not on file  . Attends Religious Services: Not on file  . Active Member of Clubs or Organizations: Not on file  . Attends Archivist Meetings: Not on file  . Marital Status: Not on file  Intimate Partner Violence:   . Fear of Current or Ex-Partner: Not on file  . Emotionally Abused: Not on file  . Physically Abused: Not on file  . Sexually Abused: Not on file    Vital Signs: Blood pressure (!) 136/96, pulse 82, temperature 97.9 F (36.6 C), resp. rate 16, height 6\' 1"   (1.854 m), weight 193 lb (87.5 kg), SpO2 98 %.  Examination: General Appearance: The patient is well-developed, well-nourished, and in no distress. Skin: Gross inspection of skin unremarkable. Head: normocephalic, no gross deformities. Eyes: no gross deformities noted. ENT: ears appear grossly normal no exudates. Neck: Supple. No thyromegaly. No LAD. Respiratory: no rhonchi noted. Cardiovascular: Normal S1 and S2 without murmur or rub. Extremities: No cyanosis. pulses are equal. Neurologic: Alert and oriented. No involuntary movements.  LABS: No results found for this or any previous visit (from the past 2160 hour(s)).  Radiology: No results found.  No results found.  No results found.    Assessment and Plan: Patient Active Problem List   Diagnosis Date Noted  . OSA on CPAP 12/14/2017  . Ascending aorta dilatation (HCC) 04/02/2017  . Palpitations 03/28/2017  . Anxiety 02/25/2017  . Chest pain 02/25/2017  . Essential hypertension 02/25/2017  . Gastroesophageal reflux disease 02/25/2017  . Barrett's esophagus 09/19/2013  . Epigastric pain 03/21/2013    1. OSA on CPAP has been having issues with compliance. Will repeat the Sleep Study at home as a convenience to him since we have a pandemic issue going on it would be better to do the home study.  In addition it will provide him with the best environment so that he can get the most optimal sleep 2. GERD under control we will continue with general precautions as far as his reflux is concerned use PPIs as ordered over-the-counter 3. Anxiety this may actually be contributing to some extent in his symptoms and so therefore we need to also take this into consideration and final diagnosis he may benefit from anxiolytics down the road if were not able to find any definitive issues in his sleep study  General Counseling: I have discussed the findings of the evaluation and examination with Nicholas Willis.  I have also discussed any  further diagnostic evaluation thatmay be needed or ordered today. Nicholas Willis verbalizes understanding of the findings of todays visit. We also reviewed his medications today and discussed drug interactions and side effects including but not limited excessive drowsiness and altered mental states. We also discussed that there is always a risk not just to him but also people around him. he has been encouraged to call the office with any questions or concerns that should arise related to todays visit.  Orders Placed This Encounter  Procedures  . Home sleep test    Order Specific Question:   Where should this test be performed:    Answer:   [facility]     Time spent: 39min  I have personally obtained a history, examined the patient, evaluated laboratory and imaging results, formulated the assessment and plan and placed orders.    Allyne Gee, MD [facility] Pulmonary and Critical Care Sleep medicine

## 2019-10-24 ENCOUNTER — Telehealth: Payer: Self-pay

## 2019-10-24 NOTE — Telephone Encounter (Signed)
Confirmed appointment on 10/28/2019 and screened for covid. klh 

## 2019-10-28 ENCOUNTER — Ambulatory Visit: Payer: BC Managed Care – PPO | Admitting: Internal Medicine

## 2019-10-28 ENCOUNTER — Other Ambulatory Visit: Payer: Self-pay

## 2019-10-28 DIAGNOSIS — G4733 Obstructive sleep apnea (adult) (pediatric): Secondary | ICD-10-CM | POA: Diagnosis not present

## 2019-11-07 ENCOUNTER — Telehealth: Payer: Self-pay

## 2019-11-07 NOTE — Telephone Encounter (Signed)
Tried contacting patient to inform of appointment on 11/11/2019, unable to leave message voicemail full. klh

## 2019-11-07 NOTE — Telephone Encounter (Signed)
Confirmed appointment on 11/11/2019 and screened for covid. klh 

## 2019-11-11 ENCOUNTER — Ambulatory Visit: Payer: BC Managed Care – PPO | Admitting: Internal Medicine

## 2019-11-11 ENCOUNTER — Other Ambulatory Visit: Payer: Self-pay

## 2019-11-11 ENCOUNTER — Encounter: Payer: Self-pay | Admitting: Internal Medicine

## 2019-11-11 VITALS — BP 140/80 | HR 78 | Resp 16 | Ht 73.0 in | Wt 190.0 lb

## 2019-11-11 DIAGNOSIS — G4733 Obstructive sleep apnea (adult) (pediatric): Secondary | ICD-10-CM | POA: Diagnosis not present

## 2019-11-11 DIAGNOSIS — I10 Essential (primary) hypertension: Secondary | ICD-10-CM

## 2019-11-11 DIAGNOSIS — K219 Gastro-esophageal reflux disease without esophagitis: Secondary | ICD-10-CM | POA: Diagnosis not present

## 2019-11-11 DIAGNOSIS — F419 Anxiety disorder, unspecified: Secondary | ICD-10-CM | POA: Diagnosis not present

## 2019-11-11 NOTE — Progress Notes (Signed)
[facility] [place], [place] [address]  Pulmonary Sleep Medicine   Office Visit Note  Patient Name: Nicholas Willis DOB: 1971-06-18 MRN [id number]  Date of Service: 11/11/2019  Complaints/HPI: Pt is here for pulmonary follow up.  He had a home sleep study.  It shows an AHI of 10.9.  He had 44 apneas, 27 obstructive, 3 central, and 30 hypopneas. He is unable to tolerate his current cpap.  He is requiring a pressure of 12 currently.  He takes it off halfway through the night because it wakes him up and he is unable to tolerate the heat and pressure. He would likely benefit from bipap as his sleep study shows continued need for pap therapy.      ROS  General: (-) fever, (-) chills, (-) night sweats, (-) weakness Skin: (-) rashes, (-) itching,. Eyes: (-) visual changes, (-) redness, (-) itching. Nose and Sinuses: (-) nasal stuffiness or itchiness, (-) postnasal drip, (-) nosebleeds, (-) sinus trouble. Mouth and Throat: (-) sore throat, (-) hoarseness. Neck: (-) swollen glands, (-) enlarged thyroid, (-) neck pain. Respiratory: - cough, (-) bloody sputum, - shortness of breath, - wheezing. Cardiovascular: - ankle swelling, (-) chest pain. Lymphatic: (-) lymph node enlargement. Neurologic: (-) numbness, (-) tingling. Psychiatric: (-) anxiety, (-) depression   Current Medication: Outpatient Encounter Medications as of 11/11/2019  Medication Sig  . hydrochlorothiazide (HYDRODIURIL) 12.5 MG tablet Take 12.5 mg by mouth daily.  Marland Kitchen losartan (COZAAR) 50 MG tablet Take 50 mg by mouth daily.  . metoprolol succinate (TOPROL-XL) 25 MG 24 hr tablet TAKE 2 TABLETS (50 MG TOTAL) BY MOUTH DAILY AT 0600.   No facility-administered encounter medications on file as of 11/11/2019.    Surgical History: Past Surgical History:  Procedure Laterality Date  . ADENOIDECTOMY      Medical History: Past Medical History:  Diagnosis Date  . GERD (gastroesophageal reflux disease)    . Rosacea     Family History: Family History  Problem Relation Age of Onset  . Hypertension Mother   . GER disease Father     Social History: Social History   Socioeconomic History  . Marital status: Married    Spouse name: Not on file  . Number of children: Not on file  . Years of education: Not on file  . Highest education level: Not on file  Occupational History  . Not on file  Tobacco Use  . Smoking status: Never Smoker  . Smokeless tobacco: Never Used  Substance and Sexual Activity  . Alcohol use: Yes    Comment: social  . Drug use: No  . Sexual activity: Not on file  Other Topics Concern  . Not on file  Social History Narrative  . Not on file   Social Determinants of Health   Financial Resource Strain:   . Difficulty of Paying Living Expenses:   Food Insecurity:   . Worried About Charity fundraiser in the Last Year:   . Arboriculturist in the Last Year:   Transportation Needs:   . Film/video editor (Medical):   Marland Kitchen Lack of Transportation (Non-Medical):   Physical Activity:   . Days of Exercise per Week:   . Minutes of Exercise per Session:   Stress:   . Feeling of Stress :   Social Connections:   . Frequency of Communication with Friends and Family:   . Frequency of Social Gatherings with Friends and Family:   . Attends Religious  Services:   . Active Member of Clubs or Organizations:   . Attends Archivist Meetings:   Marland Kitchen Marital Status:   Intimate Partner Violence:   . Fear of Current or Ex-Partner:   . Emotionally Abused:   Marland Kitchen Physically Abused:   . Sexually Abused:     Vital Signs: Blood pressure 140/80, pulse 78, resp. rate 16, height 6\' 1"  (1.854 m), weight 190 lb (86.2 kg), SpO2 98 %.  Examination: General Appearance: The patient is well-developed, well-nourished, and in no distress. Skin: Gross inspection of skin unremarkable. Head: normocephalic, no gross deformities. Eyes: no gross deformities noted. ENT: ears appear  grossly normal no exudates. Neck: Supple. No thyromegaly. No LAD. Respiratory: clear bilaterally. Cardiovascular: Normal S1 and S2 without murmur or rub. Extremities: No cyanosis. pulses are equal. Neurologic: Alert and oriented. No involuntary movements.  LABS: No results found for this or any previous visit (from the past 2160 hour(s)).  Radiology: No results found.  No results found.  No results found.    Assessment and Plan: Patient Active Problem List   Diagnosis Date Noted  . OSA on CPAP 12/14/2017  . Ascending aorta dilatation (HCC) 04/02/2017  . Palpitations 03/28/2017  . Anxiety 02/25/2017  . Chest pain 02/25/2017  . Essential hypertension 02/25/2017  . Gastroesophageal reflux disease 02/25/2017  . Barrett's esophagus 09/19/2013  . Epigastric pain 03/21/2013   1. OSA (obstructive sleep apnea) Pt unable to tolerate cpap, would benefit from bipap at this time.  Should start 12/7.  - For home use only DME Bipap  2. Gastroesophageal reflux disease without esophagitis Stable, continue present management by pcp.   3. Anxiety Stable, continue present management by pcp   4. Essential hypertension Well controlled currently.  Continues to be managed by pcp.  General Counseling: I have discussed the findings of the evaluation and examination with Nicholas Willis.  I have also discussed any further diagnostic evaluation thatmay be needed or ordered today. Nicholas Willis verbalizes understanding of the findings of todays visit. We also reviewed his medications today and discussed drug interactions and side effects including but not limited excessive drowsiness and altered mental states. We also discussed that there is always a risk not just to him but also people around him. he has been encouraged to call the office with any questions or concerns that should arise related to todays visit.  No orders of the defined types were placed in this encounter.    Time spent: 25 This  patient was seen by Orson Gear AGNP-C in Collaboration with Dr. Devona Konig as a part of collaborative care agreement.   I have personally obtained a history, examined the patient, evaluated laboratory and imaging results, formulated the assessment and plan and placed orders.    Allyne Gee, MD [facility] Pulmonary and Critical Care Sleep medicine

## 2019-11-13 ENCOUNTER — Telehealth: Payer: Self-pay

## 2019-11-13 NOTE — Telephone Encounter (Signed)
Gave kelly from [facility] DME order for BIPAP and copy of sleep study and office note

## 2019-11-13 NOTE — Procedures (Signed)
[facility] [place], [place] [address]  Sleep Specialist: Allyne Gee, MD [place] Sleep Study Interpretation  Patient Name: [name] Patient MR [id number] DOB:1971-03-17  Date of Study: October 28, 2019  Indications for study: Obstructive sleep apnea  BMI: 25.4 kg/m       Respiratory Data:  Total AHI: 10.9/h  Total Obstructive Apneas: 27  Total Central Apneas: 3  Total Mixed Apneas: 1  Total Hypopneas: 30  If the AHI is greater than 5 per hour patient qualifies for PAP evaluation  Oximetry Data:  Oxygen Desaturation Index: 12.7/h  Lowest Desaturation: 75%  Cardiac Data:  Minimum Heart Rate: 59  Maximum Heart Rate: 103   Impression / Diagnosis:  This apnea study indicates presence of mild obstructive sleep apnea with an AHI of 10.9/h.  Patient had significant oxygen desaturations with the lowest saturation of 75%.  CPAP titration is recommended to determine optimal response to therapy.  There is also bradycardia noted cardiac evaluation clinical correlation recommended.  GENERAL Recommendations:  1.  Consider Auto PAP with pressure ranges 5-20 cmH20 with download, or facility based PAP Titration Study  2.  Consider PAP interface mask fitted for patient comfort, Heated Humidification & PAP compliance monitoring (1 month, 3 months & 12 months after PAP initiation)  3. Consider treatment with mandibular advancement splint (MAS) or referral to an ENT surgeon for modification to the upper airway if the patient prefers an alternate therapy or the PAP trial is unsuccessful  4. Sleep hygiene measures should be discussed with the patient  5. Behavioral therapy such as weight reduction or smoking cessation as appropriate for the patient  6. Advise patient against the use of alcohol or sedatives in so much as these substances can worsen excessive daytime sleepiness and respiratory disturbances of sleep  7. Advise  patient against participating in potentially dangerous activities while drowsy such as operating a motor vehicle, heavy equipment or power tools as it can put them and others in danger  8. Advise patient of the long term consequences of OSA if left untreated, need for treatment and close follow up  9. Clinical follow up as deemed necessary     This Level III home sleep study was performed using the [employer], a 4 channel screening device subject to limitations. Depending on actual total sleep time, not measured in this study, the AHI (sum of apneas and hypopneas/hr of sleep) and therefore the severity of sleep apnea may be underestimated. As with any single night study, including Level 1 attended PSG, severity of sleep apnea may also be underestimated due to the lack of supine and/or REM sleep.  The interpretation associated with this report is based on normal values and degrees of severity in accordance with AASM parameters and/or estimated from multiple sources in the literature for adults ages 34-80+. These may not agree with the displayed values. The patient's treating physician should use the interpretation and recommendations in conjunction with the overall clinical evaluation and treatment of the patient.  Some of the terminology used in this scored [facility]nea[facility] report was developed several years ago and may not always be in accordance with current nomenclature. This in no way affects the accuracy of the data or the reliability of the interpretation and recommendations.

## 2019-11-20 ENCOUNTER — Other Ambulatory Visit: Payer: BC Managed Care – PPO

## 2019-11-20 ENCOUNTER — Other Ambulatory Visit: Payer: Self-pay

## 2019-11-20 DIAGNOSIS — Z125 Encounter for screening for malignant neoplasm of prostate: Secondary | ICD-10-CM

## 2019-11-20 DIAGNOSIS — I10 Essential (primary) hypertension: Secondary | ICD-10-CM

## 2019-11-21 ENCOUNTER — Ambulatory Visit: Payer: BC Managed Care – PPO | Admitting: Internal Medicine

## 2019-11-21 LAB — CBC WITH DIFFERENTIAL/PLATELET
Basophils Absolute: 0.1 10*3/uL (ref 0.0–0.2)
Basos: 1 %
EOS (ABSOLUTE): 0.2 10*3/uL (ref 0.0–0.4)
Eos: 3 %
Hematocrit: 43.9 % (ref 37.5–51.0)
Hemoglobin: 15.7 g/dL (ref 13.0–17.7)
Immature Grans (Abs): 0 10*3/uL (ref 0.0–0.1)
Immature Granulocytes: 0 %
Lymphocytes Absolute: 2 10*3/uL (ref 0.7–3.1)
Lymphs: 37 %
MCH: 32.6 pg (ref 26.6–33.0)
MCHC: 35.8 g/dL — ABNORMAL HIGH (ref 31.5–35.7)
MCV: 91 fL (ref 79–97)
Monocytes Absolute: 0.4 10*3/uL (ref 0.1–0.9)
Monocytes: 8 %
Neutrophils Absolute: 2.8 10*3/uL (ref 1.4–7.0)
Neutrophils: 51 %
Platelets: 201 10*3/uL (ref 150–450)
RBC: 4.81 x10E6/uL (ref 4.14–5.80)
RDW: 12.5 % (ref 11.6–15.4)
WBC: 5.5 10*3/uL (ref 3.4–10.8)

## 2019-11-21 LAB — COMPREHENSIVE METABOLIC PANEL
ALT: 50 IU/L — ABNORMAL HIGH (ref 0–44)
AST: 35 IU/L (ref 0–40)
Albumin/Globulin Ratio: 1.9 (ref 1.2–2.2)
Albumin: 4.4 g/dL (ref 4.0–5.0)
Alkaline Phosphatase: 53 IU/L (ref 39–117)
BUN/Creatinine Ratio: 14 (ref 9–20)
BUN: 13 mg/dL (ref 6–24)
Bilirubin Total: 1.3 mg/dL — ABNORMAL HIGH (ref 0.0–1.2)
CO2: 25 mmol/L (ref 20–29)
Calcium: 9.1 mg/dL (ref 8.7–10.2)
Chloride: 99 mmol/L (ref 96–106)
Creatinine, Ser: 0.9 mg/dL (ref 0.76–1.27)
GFR calc Af Amer: 116 mL/min/{1.73_m2} (ref >59)
GFR calc non Af Amer: 100 mL/min/{1.73_m2} (ref >59)
Globulin, Total: 2.3 g/dL (ref 1.5–4.5)
Glucose: 87 mg/dL (ref 65–99)
Potassium: 4 mmol/L (ref 3.5–5.2)
Sodium: 139 mmol/L (ref 134–144)
Total Protein: 6.7 g/dL (ref 6.0–8.5)

## 2019-11-21 LAB — PSA: Prostate Specific Ag, Serum: 1.3 ng/mL (ref 0.0–4.0)

## 2019-11-21 LAB — LIPID PANEL
Chol/HDL Ratio: 2.6 ratio (ref 0.0–5.0)
Cholesterol, Total: 159 mg/dL (ref 100–199)
HDL: 61 mg/dL (ref >39)
LDL Chol Calc (NIH): 86 mg/dL (ref 0–99)
Triglycerides: 58 mg/dL (ref 0–149)
VLDL Cholesterol Cal: 12 mg/dL (ref 5–40)

## 2019-11-26 ENCOUNTER — Other Ambulatory Visit: Payer: Self-pay | Admitting: Internal Medicine

## 2019-11-26 DIAGNOSIS — G4733 Obstructive sleep apnea (adult) (pediatric): Secondary | ICD-10-CM

## 2019-12-10 ENCOUNTER — Telehealth: Payer: Self-pay

## 2019-12-10 NOTE — Telephone Encounter (Signed)
Unable to lmom Confirmed PT sleep study.

## 2019-12-11 ENCOUNTER — Ambulatory Visit (INDEPENDENT_AMBULATORY_CARE_PROVIDER_SITE_OTHER): Payer: BC Managed Care – PPO | Admitting: Internal Medicine

## 2019-12-11 ENCOUNTER — Telehealth: Payer: Self-pay

## 2019-12-11 DIAGNOSIS — G4733 Obstructive sleep apnea (adult) (pediatric): Secondary | ICD-10-CM | POA: Diagnosis not present

## 2019-12-11 NOTE — Telephone Encounter (Signed)
Confirmed appointment on 12/11/2019 and screened for covid. klh 

## 2019-12-13 ENCOUNTER — Telehealth: Payer: Self-pay | Admitting: Internal Medicine

## 2019-12-13 NOTE — Telephone Encounter (Signed)
Dr Devona Konig has sleep study records for review and pt should keep appointment as already scheduled, if he calls sooner please take message for [facility] or Dr Humphrey Rolls and we can call pt back. Nicholas Willis

## 2019-12-19 ENCOUNTER — Ambulatory Visit: Payer: BC Managed Care – PPO | Admitting: Internal Medicine

## 2019-12-19 ENCOUNTER — Telehealth: Payer: Self-pay

## 2019-12-19 NOTE — Telephone Encounter (Signed)
Confirmed appointment on 12/23/2019 and screened for covid. klh 

## 2019-12-23 ENCOUNTER — Telehealth: Payer: Self-pay

## 2019-12-23 ENCOUNTER — Ambulatory Visit: Payer: BC Managed Care – PPO | Admitting: Internal Medicine

## 2019-12-23 NOTE — Telephone Encounter (Signed)
Left a message advising pt we were sending new pressure change to [facility]. Explaining the reasons and to call me back if he has any other questions. Beth

## 2019-12-24 ENCOUNTER — Ambulatory Visit: Payer: BC Managed Care – PPO | Admitting: Internal Medicine

## 2020-02-20 ENCOUNTER — Other Ambulatory Visit: Payer: Self-pay

## 2020-02-20 MED ORDER — DEXLANSOPRAZOLE 30 MG PO CPDR: 30.0000 mg | DELAYED_RELEASE_CAPSULE | Freq: Every day | ORAL | 2 refills | Status: DC

## 2020-02-23 NOTE — Procedures (Signed)
[facility] [address] [place], [place] [address]  Patient Name: Nicholas Willis DOB: [birth year]   SLEEP STUDY INTERPRETATION  DATE OF SERVICE: Dec 11, 2019   SLEEP STUDY HISTORY: This patient is referred to the sleep lab for a baseline Polysomnography. Pertinent history includes a history of diagnosis of excessive daytime somnolence and snoring.  PROCEDURE: This overnight polysomnogram was performed using the [employer] 5 acquisition system using the standard diagnostic protocol as outlined by the AASM. This includes 6 channels of EEG, 2 channelscannels of EOG, chin EMG, bilateral anterior tibialis EMG, nasal/oral thermister, PTAF, chest and abdominal wall movements, ECG and pulse oximetry. Apneas and Hypopneas were scored per AASM definition.  SLEEP ARCHITECHTURE: This is a baseline polysomnograph  study. The total recording time was 367.3 minutes and the patients total sleep time is noted to be 290.5 minutes. Sleep onset latency was 6.8 minutes and is decreased.  Stage R sleep onset latency was 67.0 minutes. Sleep maintenance efficiency was 85.3% and is within normal limits.  Sleep staging expressed as a percentage of total sleep time demonstrated 12.9% N1, 48.9% N2 and 18.1% N3  sleep. Stage R represents 20.1% of total sleep time. This is somewhat decreased.  There were a total of 108 arousals  for an overall arousal index of 22.3 per hour of sleep. PLMS arousal were noted. Arousals without respiratory events are  noted. This can contribute to sleep architechture disruption.  RESPIRATORY MONITORING:   Patient exhibits some evidence of of sleep disorderd breathing characterized by 0 central apneas, 31 obstructive apneas and 0 mixed apneas. There were 46 obstructive hypopneas and 3 RERAs. Most of the apneas/hypopneas were of obstructive variety. The total apnea hypopnea index (apneas and hypopneas per hour of sleep) is 16.1 respiratory events per hour and is  moderate.  Respiratory monitoring demonstrated very mild snoring through the night. There are a total of 7 snoring episodes representing 0.2% of sleep.   Baseline oxygen saturation during wakefulness was 97% and during NREM sleep averaged 97% through the night. Arterial saturation during REM sleep was 97% through the night. There was some significant  oxygen desaturation with the respiratory events. Arterial oxygen desaturation occurred of at least 4% was noted with a low saturation of 88%. The study was performed off oxygen.  CARDIAC MONITORING:   Average heart rate is 63 during sleep with a high of 93 beats per minute. Malignant arrhythmias were not noted.  CPAP titration: Patient was started on the CPAP before titrated up to a maximum of 9 CWP.  Patient achieved good response with the CPAP and it appears that on a pressure of 9 CWP patient slept for 21.1 minutes with 100% sleep noted.  On this pressure there were no apneas no hypopneas and no significant oxygen desaturations below 96%.  Total AHI was 0.0/h and patient was able to tolerate the pressure   IMPRESSIONS:  --This overnight polysomnogram demonstrates presence of moderate sleep apnea with an overall AHI 16.1 per hour. --The overall AHI was no worse  during Stage R. --There were associated some arterial oxygen desaturations noted with low saturation of 88% --There very mild significant PLMS noted in this study. --There is very mild snoring noted throughout the study.    RECOMMENDATIONS:  --CPAP titration study is adequate to control this patient's obstructive sleep apnea.  The optimal pressure appears to be 9 CWP. --Nasal decongestants and antihistamines may be of help for increased upper airways resistance when present. --Weight loss through dietary  and lifestyle modification is recommended in the presence of obesity. --A search for and treatment of any underlying cardiopulmonary disease is      recommended in the presence of  oxygen desaturations. --Alternative treatment options if the patient is not willing to use CPAP include oral   appliances as well as surgical intervention which may help in the appropriate patient. --Clinical correlation is recommended. Please feel free to call the office for any further  questions or assistance in the care of this patient.     Allyne Gee, MD [facility] Pulmonary Critical Care Medicine Sleep medicine

## 2020-04-15 ENCOUNTER — Ambulatory Visit: Payer: BC Managed Care – PPO | Admitting: Gastroenterology

## 2020-04-15 ENCOUNTER — Other Ambulatory Visit: Payer: Self-pay

## 2020-04-15 ENCOUNTER — Encounter: Payer: Self-pay | Admitting: Gastroenterology

## 2020-04-15 VITALS — BP 119/66 | HR 81 | Temp 97.8°F | Ht 73.0 in | Wt 186.4 lb

## 2020-04-15 DIAGNOSIS — K219 Gastro-esophageal reflux disease without esophagitis: Secondary | ICD-10-CM | POA: Diagnosis not present

## 2020-04-15 MED ORDER — DEXLANSOPRAZOLE 30 MG PO CPDR: 30.0000 mg | DELAYED_RELEASE_CAPSULE | Freq: Every day | ORAL | 11 refills | Status: DC

## 2020-04-15 NOTE — Progress Notes (Signed)
Primary Care Physician: Sofie Hartigan, MD  Primary Gastroenterologist:  Dr. Lucilla Willis  Chief Complaint  Patient presents with  . Follow up GERD    HPI: Nicholas Willis is a 49 y.o. male here for follow-up of GERD had been on Dexilant and reported at his last visit that he stopped it for 5 days without any resulting heartburn.  The patient wanted to go to a lower dose at the time of the patient also was given a prescription for 30 mg of Dexilant to be taken every day.  The patient also had esophageal manometry in the past the results of that are in my last progress note.  The patient reports that he did have some issues in the morning with some trouble with a dry throat and swallowing.  The patient states he has had been on a CPAP for sleep apnea.  The patient restarted taking his Dexilant about a month ago.  He also is in need of her refill of his medication.  The paindenies having a recent upper endoscopy or Ever having a colonoscopy.  Past Medical History:  Diagnosis Date  . GERD (gastroesophageal reflux disease)   . Rosacea     Current Outpatient Medications  Medication Sig Dispense Refill  . amLODipine (NORVASC) 5 MG tablet Take 5 mg by mouth daily.    Marland Kitchen Dexlansoprazole 30 MG capsule Take 1 capsule (30 mg total) by mouth daily. 30 capsule 2  . hydrochlorothiazide (HYDRODIURIL) 12.5 MG tablet Take 12.5 mg by mouth daily.    Marland Kitchen losartan (COZAAR) 50 MG tablet Take 50 mg by mouth daily.    . traZODone (DESYREL) 50 MG tablet Take by mouth.     No current facility-administered medications for this visit.    Allergies as of 04/15/2020  . (No Known Allergies)    ROS:  General: Negative for anorexia, weight loss, fever, chills, fatigue, weakness. ENT: Negative for hoarseness, difficulty swallowing , nasal congestion. CV: Negative for chest pain, angina, palpitations, dyspnea on exertion, peripheral edema.  Respiratory: Negative for dyspnea at rest, dyspnea on  exertion, cough, sputum, wheezing.  GI: See history of present illness. GU:  Negative for dysuria, hematuria, urinary incontinence, urinary frequency, nocturnal urination.  Endo: Negative for unusual weight change.    Physical Examination:   BP 119/66   Pulse 81   Temp 97.8 F (36.6 C) (Temporal)   Ht 6\' 1"  (1.854 m)   Wt 186 lb 6.4 oz (84.6 kg)   BMI 24.59 kg/m   General: Well-nourished, well-developed in no acute distress.  Eyes: No icterus. Conjunctivae pink. Lungs: Clear to auscultation bilaterally. Non-labored. Heart: Regular rate and rhythm, no murmurs rubs or gallops.  Abdomen: Bowel sounds are normal, nontender, nondistended, no hepatosplenomegaly or masses, no abdominal bruits or hernia , no rebound or guarding.   Extremities: No lower extremity edema. No clubbing or deformities. Neuro: Alert and oriented x 3.  Grossly intact. Skin: Warm and dry, no jaundice.   Psych: Alert and cooperative, normal mood and affect.  Labs:    Imaging Studies: No results found.  Assessment and Plan:   Nicholas Willis is a 49 y.o. y/o male Who comes in today with a history of GERD and some question of having Barrett's esophagus.  The patient will be set up for a screening colonoscopy and an upper endoscopy due to his GERD and questionable Barrett's esophagus.  The patient will also have his medication refills.  The patient has been explained  the plan and agrees with it.     Nicholas Lame, MD. Marval Regal    Note: This dictation was prepared with Dragon dictation along with smaller phrase technology. Any transcriptional errors that result from this process are unintentional.

## 2020-06-27 NOTE — Progress Notes (Signed)
Patient was no-show for appointment.  The office staff will contact the patient for rescheduling follow-up. 

## 2020-11-30 ENCOUNTER — Other Ambulatory Visit: Payer: Self-pay

## 2020-11-30 ENCOUNTER — Ambulatory Visit: Payer: BC Managed Care – PPO | Admitting: Medical

## 2020-11-30 VITALS — BP 152/80 | HR 88 | Temp 98.3°F | Resp 16 | Wt 179.2 lb

## 2020-11-30 DIAGNOSIS — S39012A Strain of muscle, fascia and tendon of lower back, initial encounter: Secondary | ICD-10-CM

## 2020-11-30 MED ORDER — CYCLOBENZAPRINE HCL 5 MG PO TABS: 5.0000 mg | ORAL_TABLET | Freq: Every day | ORAL | 1 refills | Status: DC

## 2020-11-30 NOTE — Progress Notes (Signed)
   Subjective:    Patient ID: Nicholas Willis, male    DOB: 1970/12/04, 50 y.o.   MRN: [id number]  HPI 50 yo  Male in non acute distress.  Walking up steps carrying a basket of clothes he thinks he twisted wrong and felt instant pain in the lumbar area midline.   he has a his tory of  2-3 years ago lifted a heavy pipe ( 50lbs),  Saw PT and did stretches got better and he has not had problems since..  Last night he had Shooting pain with getting up yesterday but not today.Walking is not painful and no difficulty. Trouble sleeping   Last night from  10:30pm till this morning. Took today off from work. Did a stretching class this morning which went well except for the hamstring stretches.               Located at waist line. He felt an electrical shock from back to hips yesterday which caused patient some concern.But once up and moving he felt better this morning.   Took  200mg  ibuprofen, last night and one Tylenol,  took nothing this morning.    .  . Review of Systems No radiation into the legs, denies numbness or tingling.   no problems with stairs or walking. Objective:   Physical Exam Constitutional:      Appearance: Normal appearance. He is normal weight.  HENT:     Head: Normocephalic and atraumatic.  Eyes:     Extraocular Movements: Extraocular movements intact.     Conjunctiva/sclera: Conjunctivae normal.     Pupils: Pupils are equal, round, and reactive to light.  Pulmonary:     Effort: Pulmonary effort is normal.  Musculoskeletal:        General: Normal range of motion.     Cervical back: Normal range of motion.  Skin:    General: Skin is warm and dry.  Neurological:     General: No focal deficit present.     Mental Status: He is alert and oriented to person, place, and time. Mental status is at baseline.  Psychiatric:        Mood and Affect: Mood normal.        Behavior: Behavior normal.        Thought Content: Thought content normal.        Judgment: Judgment  normal.   He easily gets up from the exam table. Gait wnl.  Non tender with palpation on midline and paravertebral 2+ reflexes popliteal bilateral. Gait wnl Flexion and extension wnl.     Assessment & Plan:  Back Strain, reassured patient. Motrin 800mg  TID x 3 days with food. Meds ordered this encounter  Medications  . cyclobenzaprine (FLEXERIL) 5 MG tablet    Sig: Take 1 tablet (5 mg total) by mouth at bedtime.    Dispense:  30 tablet    Refill:  1   Continue heat and ice to the area.  Return in  3-5 days if not improving.call me or make an appointment at which point I may try steroids to see if we can reduce inflammation. I recommended he not exercise for  7 days to give his back rest and he states he had no trouble stretching this morning except the hamstring was tight. Patinet verbalizes understanding and has no questions at discharge. Patient will review AVS in chart.

## 2020-11-30 NOTE — Patient Instructions (Addendum)
Back Strain Motrin 800mg  ( 4 tablets) TID x 3 days with food. Meds ordered this encounter  Medications  . cyclobenzaprine (FLEXERIL) 5 MG tablet    Sig: Take 1 tablet (5 mg total) by mouth at bedtime.    Dispense:  30 tablet    Refill:  1  This medication can cause sedation.    Lumbar Strain A lumbar strain, which is sometimes called a low-back strain, is a stretch or tear in a muscle or the strong cords of tissue that attach muscle to bone (tendons) in the lower back (lumbar spine). This type of injury occurs when muscles or tendons are torn or are stretched beyond their limits. Lumbar strains can range from mild to severe. Mild strains may involve stretching a muscle or tendon without tearing it. These may heal in 1-2 weeks. More severe strains involve tearing of muscle fibers or tendons. These will cause more pain and may take 6-8 weeks to heal. What are the causes? This condition may be caused by:  Trauma, such as a fall or a hit to the body.  Twisting or overstretching the back. This may result from doing activities that need a lot of energy, such as lifting heavy objects. What increases the risk? This injury is more common in:  Athletes.  People with obesity.  People who do repeated lifting, bending, or other movements that involve their back. What are the signs or symptoms? Symptoms of this condition may include:  Sharp or dull pain in the lower back that does not go away. The pain may extend to the buttocks.  Stiffness or limited range of motion.  Sudden muscle tightening (spasms). How is this diagnosed? This condition may be diagnosed based on:  Your symptoms.  Your medical history.  A physical exam.  Imaging tests, such as: ? X-rays. ? MRI. How is this treated? Treatment for this condition may include:  Rest.  Applying heat and cold to the affected area.  Over-the-counter medicines to help relieve pain and inflammation, such as NSAIDs.  Prescription  pain medicine and muscle relaxants may be needed for a short time.  Physical therapy. Follow these instructions at home: Managing pain, stiffness, and swelling  If directed, put ice on the injured area during the first 24 hours after your injury. ? Put ice in a plastic bag. ? Place a towel between your skin and the bag. ? Leave the ice on for 20 minutes, 2-3 times a day.  If directed, apply heat to the affected area as often as told by your health care provider. Use the heat source that your health care provider recommends, such as a moist heat pack or a heating pad. ? Place a towel between your skin and the heat source. ? Leave the heat on for 20-30 minutes. ? Remove the heat if your skin turns bright red. This is especially important if you are unable to feel pain, heat, or cold. You may have a greater risk of getting burned.      Activity  Rest and return to your normal activities as told by your health care provider. Ask your health care provider what activities are safe for you.  Do exercises as told by your health care provider. Medicines  Take over-the-counter and prescription medicines only as told by your health care provider.  Ask your health care provider if the medicine prescribed to you: ? Requires you to avoid driving or using heavy machinery. ? Can cause constipation. You may need to  take these actions to prevent or treat constipation:  Drink enough fluid to keep your urine pale yellow.  Take over-the-counter or prescription medicines.  Eat foods that are high in fiber, such as beans, whole grains, and fresh fruits and vegetables.  Limit foods that are high in fat and processed sugars, such as fried or sweet foods. Injury prevention To prevent a future low-back injury:  Always warm up properly before physical activity or sports.  Cool down and stretch after being active.  Use correct form when playing sports and lifting heavy objects. Bend your knees before  you lift heavy objects.  Use good posture when sitting and standing.  Stay physically fit and keep a healthy weight. ? Do at least 150 minutes of moderate-intensity exercise each week, such as brisk walking or water aerobics. ? Do strength exercises at least 2 times each week.   General instructions  Do not use any products that contain nicotine or tobacco, such as cigarettes, e-cigarettes, and chewing tobacco. If you need help quitting, ask your health care provider.  Keep all follow-up visits as told by your health care provider. This is important. Contact a health care provider if:  Your back pain does not improve after 6 weeks of treatment.  Your symptoms get worse. Get help right away if:  Your back pain is severe.  You are unable to stand or walk.  You develop pain in your legs.  You develop weakness in your buttocks or legs.  You have difficulty controlling when you urinate or when you have a bowel movement. ? You have frequent, painful, or bloody urination. ? You have a temperature over 101.7F (38.3C) Summary  A lumbar strain, which is sometimes called a low-back strain, is a stretch or tear in a muscle or the strong cords of tissue that attach muscle to bone (tendons) in the lower back (lumbar spine).  This type of injury occurs when muscles or tendons are torn or are stretched beyond their limits.  Rest and return to your normal activities as told by your health care provider. If directed, apply heat and ice to the affected area as often as told by your health care provider.  Take over-the-counter and prescription medicines only as told by your health care provider.  Contact a health care provider if you have new or worsening symptoms. This information is not intended to replace advice given to you by your health care provider. Make sure you discuss any questions you have with your health care provider. Document Revised: 05/24/2018 Document Reviewed:  05/24/2018 Elsevier Patient Education  2021 [employer]   Low Back Sprain or Strain Rehab Ask your health care provider which exercises are safe for you. Do exercises exactly as told by your health care provider and adjust them as directed. It is normal to feel mild stretching, pulling, tightness, or discomfort as you do these exercises. Stop right away if you feel sudden pain or your pain gets worse. Do not begin these exercises until told by your health care provider. Stretching and range-of-motion exercises These exercises warm up your muscles and joints and improve the movement and flexibility of your back. These exercises also help to relieve pain, numbness, and tingling. Lumbar rotation 1. Lie on your back on a firm surface and bend your knees. 2. Straighten your arms out to your sides so each arm forms a 90-degree angle (right angle) with a side of your body. 3. Slowly move (rotate) both of your knees to one  side of your body until you feel a stretch in your lower back (lumbar). Try not to let your shoulders lift off the floor. 4. Hold this position for __________ seconds. 5. Tense your abdominal muscles and slowly move your knees back to the starting position. 6. Repeat this exercise on the other side of your body. Repeat __________ times. Complete this exercise __________ times a day.   Single knee to chest 1. Lie on your back on a firm surface with both legs straight. 2. Bend one of your knees. Use your hands to move your knee up toward your chest until you feel a gentle stretch in your lower back and buttock. ? Hold your leg in this position by holding on to the front of your knee. ? Keep your other leg as straight as possible. 3. Hold this position for __________ seconds. 4. Slowly return to the starting position. 5. Repeat with your other leg. Repeat __________ times. Complete this exercise __________ times a day.   Prone extension on elbows 1. Lie on your abdomen on a firm  surface (prone position). 2. Prop yourself up on your elbows. 3. Use your arms to help lift your chest up until you feel a gentle stretch in your abdomen and your lower back. ? This will place some of your body weight on your elbows. If this is uncomfortable, try stacking pillows under your chest. ? Your hips should stay down, against the surface that you are lying on. Keep your hip and back muscles relaxed. 4. Hold this position for __________ seconds. 5. Slowly relax your upper body and return to the starting position. Repeat __________ times. Complete this exercise __________ times a day.   Strengthening exercises These exercises build strength and endurance in your back. Endurance is the ability to use your muscles for a long time, even after they get tired. Pelvic tilt This exercise strengthens the muscles that lie deep in the abdomen. 1. Lie on your back on a firm surface. Bend your knees and keep your feet flat on the floor. 2. Tense your abdominal muscles. Tip your pelvis up toward the ceiling and flatten your lower back into the floor. ? To help with this exercise, you may place a small towel under your lower back and try to push your back into the towel. 3. Hold this position for __________ seconds. 4. Let your muscles relax completely before you repeat this exercise. Repeat __________ times. Complete this exercise __________ times a day. Alternating arm and leg raises 1. Get on your hands and knees on a firm surface. If you are on a hard floor, you may want to use padding, such as an exercise mat, to cushion your knees. 2. Line up your arms and legs. Your hands should be directly below your shoulders, and your knees should be directly below your hips. 3. Lift your left leg behind you. At the same time, raise your right arm and straighten it in front of you. ? Do not lift your leg higher than your hip. ? Do not lift your arm higher than your shoulder. ? Keep your abdominal and back  muscles tight. ? Keep your hips facing the ground. ? Do not arch your back. ? Keep your balance carefully, and do not hold your breath. 4. Hold this position for __________ seconds. 5. Slowly return to the starting position. 6. Repeat with your right leg and your left arm. Repeat __________ times. Complete this exercise __________ times a day.   Abdominal  set with straight leg raise 1. Lie on your back on a firm surface. 2. Bend one of your knees and keep your other leg straight. 3. Tense your abdominal muscles and lift your straight leg up, 4-6 inches (10-15 cm) off the ground. 4. Keep your abdominal muscles tight and hold this position for __________ seconds. ? Do not hold your breath. ? Do not arch your back. Keep it flat against the ground. 5. Keep your abdominal muscles tense as you slowly lower your leg back to the starting position. 6. Repeat with your other leg. Repeat __________ times. Complete this exercise __________ times a day.   Single leg lower with bent knees 1. Lie on your back on a firm surface. 2. Tense your abdominal muscles and lift your feet off the floor, one foot at a time, so your knees and hips are bent in 90-degree angles (right angles). ? Your knees should be over your hips and your lower legs should be parallel to the floor. 3. Keeping your abdominal muscles tense and your knee bent, slowly lower one of your legs so your toe touches the ground. 4. Lift your leg back up to return to the starting position. ? Do not hold your breath. ? Do not let your back arch. Keep your back flat against the ground. 5. Repeat with your other leg. Repeat __________ times. Complete this exercise __________ times a day. Posture and body mechanics Good posture and healthy body mechanics can help to relieve stress in your body's tissues and joints. Body mechanics refers to the movements and positions of your body while you do your daily activities. Posture is part of body  mechanics. Good posture means:  Your spine is in its natural S-curve position (neutral).  Your shoulders are pulled back slightly.  Your head is not tipped forward. Follow these guidelines to improve your posture and body mechanics in your everyday activities. Standing  When standing, keep your spine neutral and your feet about hip width apart. Keep a slight bend in your knees. Your ears, shoulders, and hips should line up.  When you do a task in which you stand in one place for a long time, place one foot up on a stable object that is 2-4 inches (5-10 cm) high, such as a footstool. This helps keep your spine neutral.   Sitting  When sitting, keep your spine neutral and keep your feet flat on the floor. Use a footrest, if necessary, and keep your thighs parallel to the floor. Avoid rounding your shoulders, and avoid tilting your head forward.  When working at a desk or a computer, keep your desk at a height where your hands are slightly lower than your elbows. Slide your chair under your desk so you are close enough to maintain good posture.  When working at a computer, place your monitor at a height where you are looking straight ahead and you do not have to tilt your head forward or downward to look at the screen.   Resting  When lying down and resting, avoid positions that are most painful for you.  If you have pain with activities such as sitting, bending, stooping, or squatting, lie in a position in which your body does not bend very much. For example, avoid curling up on your side with your arms and knees near your chest (fetal position).  If you have pain with activities such as standing for a long time or reaching with your arms, lie with your spine  in a neutral position and bend your knees slightly. Try the following positions: ? Lying on your side with a pillow between your knees. ? Lying on your back with a pillow under your knees. Lifting  When lifting objects, keep your  feet at least shoulder width apart and tighten your abdominal muscles.  Bend your knees and hips and keep your spine neutral. It is important to lift using the strength of your legs, not your back. Do not lock your knees straight out.  Always ask for help to lift heavy or awkward objects.   This information is not intended to replace advice given to you by your health care provider. Make sure you discuss any questions you have with your health care provider. Document Revised: 11/16/2018 Document Reviewed: 08/16/2018 Elsevier Patient Education  [place].

## 2020-12-08 ENCOUNTER — Other Ambulatory Visit: Payer: BC Managed Care – PPO

## 2020-12-08 ENCOUNTER — Other Ambulatory Visit: Payer: Self-pay

## 2020-12-08 DIAGNOSIS — Z0000 Encounter for general adult medical examination without abnormal findings: Secondary | ICD-10-CM

## 2020-12-08 DIAGNOSIS — Z125 Encounter for screening for malignant neoplasm of prostate: Secondary | ICD-10-CM

## 2020-12-09 LAB — CBC WITH DIFFERENTIAL/PLATELET
Basophils Absolute: 0.1 10*3/uL (ref 0.0–0.2)
Basos: 1 %
EOS (ABSOLUTE): 0.1 10*3/uL (ref 0.0–0.4)
Eos: 2 %
Hematocrit: 43.6 % (ref 37.5–51.0)
Hemoglobin: 15.2 g/dL (ref 13.0–17.7)
Immature Grans (Abs): 0 10*3/uL (ref 0.0–0.1)
Immature Granulocytes: 0 %
Lymphocytes Absolute: 1.9 10*3/uL (ref 0.7–3.1)
Lymphs: 38 %
MCH: 31.9 pg (ref 26.6–33.0)
MCHC: 34.9 g/dL (ref 31.5–35.7)
MCV: 91 fL (ref 79–97)
Monocytes Absolute: 0.5 10*3/uL (ref 0.1–0.9)
Monocytes: 10 %
Neutrophils Absolute: 2.6 10*3/uL (ref 1.4–7.0)
Neutrophils: 49 %
Platelets: 203 10*3/uL (ref 150–450)
RBC: 4.77 x10E6/uL (ref 4.14–5.80)
RDW: 12.6 % (ref 11.6–15.4)
WBC: 5.1 10*3/uL (ref 3.4–10.8)

## 2020-12-09 LAB — COMPREHENSIVE METABOLIC PANEL
ALT: 34 IU/L (ref 0–44)
AST: 32 IU/L (ref 0–40)
Albumin/Globulin Ratio: 2 (ref 1.2–2.2)
Albumin: 4.5 g/dL (ref 4.0–5.0)
Alkaline Phosphatase: 51 IU/L (ref 44–121)
BUN/Creatinine Ratio: 14 (ref 9–20)
BUN: 12 mg/dL (ref 6–24)
Bilirubin Total: 1.5 mg/dL — ABNORMAL HIGH (ref 0.0–1.2)
CO2: 25 mmol/L (ref 20–29)
Calcium: 9.4 mg/dL (ref 8.7–10.2)
Chloride: 99 mmol/L (ref 96–106)
Creatinine, Ser: 0.87 mg/dL (ref 0.76–1.27)
Globulin, Total: 2.2 g/dL (ref 1.5–4.5)
Glucose: 99 mg/dL (ref 65–99)
Potassium: 3.9 mmol/L (ref 3.5–5.2)
Sodium: 137 mmol/L (ref 134–144)
Total Protein: 6.7 g/dL (ref 6.0–8.5)
eGFR: 105 mL/min/{1.73_m2} (ref >59)

## 2020-12-09 LAB — LIPID PANEL
Chol/HDL Ratio: 2.7 ratio (ref 0.0–5.0)
Cholesterol, Total: 181 mg/dL (ref 100–199)
HDL: 68 mg/dL (ref >39)
LDL Chol Calc (NIH): 100 mg/dL — ABNORMAL HIGH (ref 0–99)
Triglycerides: 71 mg/dL (ref 0–149)
VLDL Cholesterol Cal: 13 mg/dL (ref 5–40)

## 2020-12-09 LAB — TSH: TSH: 1.67 u[IU]/mL (ref 0.450–4.500)

## 2020-12-09 LAB — PSA: Prostate Specific Ag, Serum: 2.9 ng/mL (ref 0.0–4.0)

## 2021-04-13 MED ORDER — PANTOPRAZOLE SODIUM 40 MG PO TBEC: 40.0000 mg | DELAYED_RELEASE_TABLET | Freq: Every day | ORAL | 1 refills | Status: DC

## 2021-04-13 NOTE — Telephone Encounter (Signed)
Dexilant is not covered by patient insurance company. Can we switch to Pantoprazole '40mg'$  daily

## 2021-05-04 ENCOUNTER — Telehealth: Payer: Self-pay | Admitting: Gastroenterology

## 2021-05-04 ENCOUNTER — Telehealth: Payer: Self-pay

## 2021-05-04 NOTE — Telephone Encounter (Signed)
Pt. Calling to schedule colonoscopy 

## 2021-05-04 NOTE — Telephone Encounter (Signed)
CALLED PATIENT NO ANSWER

## 2021-05-17 ENCOUNTER — Encounter: Payer: Self-pay | Admitting: Medical

## 2021-05-17 ENCOUNTER — Ambulatory Visit: Payer: BC Managed Care – PPO | Admitting: Medical

## 2021-05-17 ENCOUNTER — Other Ambulatory Visit: Payer: Self-pay

## 2021-05-17 VITALS — BP 130/86 | HR 90 | Temp 98.6°F | Resp 16 | Ht 73.0 in | Wt 181.0 lb

## 2021-05-17 DIAGNOSIS — H1089 Other conjunctivitis: Secondary | ICD-10-CM

## 2021-05-17 DIAGNOSIS — H00012 Hordeolum externum right lower eyelid: Secondary | ICD-10-CM

## 2021-05-17 DIAGNOSIS — L089 Local infection of the skin and subcutaneous tissue, unspecified: Secondary | ICD-10-CM

## 2021-05-17 MED ORDER — AMOXICILLIN-POT CLAVULANATE 875-125 MG PO TABS
1.0000 | ORAL_TABLET | Freq: Two times a day (BID) | ORAL | 10 refills | Status: DC
Start: 2021-05-17 — End: 2021-12-02

## 2021-05-17 MED ORDER — CIPROFLOXACIN HCL 0.3 % OP SOLN: 1.0000 [drp] | OPHTHALMIC | 0 refills | Status: DC

## 2021-05-17 NOTE — Progress Notes (Signed)
   Subjective:    Patient ID: Nicholas Willis, male    DOB: June 06, 1971, 50 y.o.   MRN: [id number]  HPI 50 yo male in non acute distress, had a right lower lid stye (medially) over the weekend. Used warm compresses to the area, which seemed to help.  However yesterday had redness on lower lid and skin area and an injected sclera.  Watering but no discharge. The stye itself has improved. No visual changes, no nasal congestion or cough, or fever or chills.   Review of Systems  Constitutional:  Negative for fatigue and fever.  HENT:  Negative for congestion and sore throat.   Eyes:  Positive for pain (tender) and redness (right). Negative for photophobia, discharge, itching and visual disturbance.  Respiratory:  Negative for cough and shortness of breath.   Gastrointestinal:  Negative for abdominal pain and diarrhea.  Neurological:  Negative for dizziness, light-headedness and headaches.      Objective:   Physical Exam Vitals and nursing note reviewed.  Constitutional:      Appearance: Normal appearance.  Eyes:     General: Lids are normal. Vision grossly intact. Gaze aligned appropriately. No scleral icterus.       Right eye: Hordeolum present. No foreign body or discharge.     Extraocular Movements: Extraocular movements intact.     Conjunctiva/sclera:     Right eye: Right conjunctiva is injected. No chemosis, exudate or hemorrhage.    Pupils: Pupils are equal, round, and reactive to light.      Comments: Scleara injected  Neurological:     Mental Status: He is alert.   20/40R, 20/50L 20/40 bilateral       Assessment & Plan:  Eye skin infection Conjunctivitis Right Hordeolum lower lid  Cool compresses to help with swelling Warm compresses to help with the sty. Meds ordered this encounter  Medications   ciprofloxacin (CILOXAN) 0.3 % ophthalmic solution    Sig: Place 1 drop into the right eye every 2 (two) hours. Administer 1 drop, every 2 hours, while awake, for 2  days. Then 1 drop, every 4 hours, while awake, for the next 5 days.    Dispense:  5 mL    Refill:  0   amoxicillin-clavulanate (AUGMENTIN) 875-125 MG tablet    Sig: Take 1 tablet by mouth 2 (two) times daily.    Dispense:  20 tablet    Refill:  10    Return in 3-5 days if not improving or follow up with your PCP, Patient verbalizes understanding and has no questions at discharge.

## 2021-05-17 NOTE — Patient Instructions (Signed)
Stye ?A stye, also known as a hordeolum, is a bump that forms on an eyelid. It may look like a pimple next to the eyelash. A stye can form inside the eyelid (internal stye) or outside the eyelid (external stye). A stye can cause redness, swelling, and pain on the eyelid. ?Styes are very common. Anyone can get them at any age. They usually occur in just one eye at a time, but you may have more than one in either eye. ?What are the causes? ?A stye is caused by an infection. The infection is almost always caused by bacteria called Staphylococcus aureus. This is a common type of bacteria that lives on the skin. ?An internal stye may result from an infected oil-producing gland inside the eyelid. An external stye may be caused by an infection at the base of the eyelash (hair follicle). ?What increases the risk? ?You are more likely to develop a stye if: ?You have had a stye before. ?You have any of these conditions: ?Red, itchy, inflamed eyelids (blepharitis). ?A skin condition such as seborrheic dermatitis or rosacea. ?High fat levels in your blood (lipids). ?Dry eyes. ?What are the signs or symptoms? ?The most common symptom of a stye is eyelid pain. Internal styes are more painful than external styes. Other symptoms may include: ?Painful swelling of your eyelid. ?A scratchy feeling in your eye. ?Tearing and redness of your eye. ?A pimple-like bump on the edge of the eyelid. ?Pus draining from the stye. ?How is this diagnosed? ?Your health care provider may be able to diagnose a stye just by examining your eye. The health care provider may also check to make sure: ?You do not have a fever or other signs of a more serious infection. ?The infection has not spread to other parts of your eye or areas around your eye. ?How is this treated? ?Most styes will clear up in a few days without treatment or with warm compresses applied to the area. You may need to use antibiotic drops or ointment to treat an infection. Sometimes,  steroid drops or ointment are used in addition to antibiotics. ?In some cases, your health care provider may give you a small steroid injection in the eyelid. ?If your stye does not heal with routine treatment, your health care provider may drain pus from the stye using a thin blade or needle. This may be done if the stye is large, causing a lot of pain, or affecting your vision. ?Follow these instructions at home: ?Take over-the-counter and prescription medicines only as told by your health care provider. This includes eye drops or ointments. ?If you were prescribed an antibiotic medicine, steroid medicine, or both, apply or use them as told by your health care provider. Do not stop using the medicine even if your condition improves. ?Apply a warm, wet cloth (warm compress) to your eye for 5-10 minutes, 4 to 6 times a day. ?Clean the affected eyelid as directed by your health care provider. ?Do not wear contact lenses or eye makeup until your stye has healed and your health care provider says that it is safe. ?Do not try to pop or drain the stye. ?Do not rub your eye. ?Contact a health care provider if: ?You have chills or a fever. ?Your stye does not go away after several days. ?Your stye affects your vision. ?Your eyeball becomes swollen, red, or painful. ?Get help right away if: ?You have pain when moving your eye around. ?Summary ?A stye is a bump that forms   on an eyelid. It may look like a pimple next to the eyelash. A stye can form inside the eyelid (internal stye) or outside the eyelid (external stye). A stye can cause redness, swelling, and pain on the eyelid. Your health care provider may be able to diagnose a stye just by examining your eye. Apply a warm, wet cloth (warm compress) to your eye for 5-10 minutes, 4 to 6 times a day. This information is not intended to replace advice given to you by your health care provider. Make sure you discuss any questions you have with your health care  provider. Document Revised: 09/30/2020 Document Reviewed: 09/30/2020 Elsevier Patient Education  [place].

## 2021-06-08 ENCOUNTER — Encounter: Payer: Self-pay | Admitting: Medical

## 2021-06-08 ENCOUNTER — Other Ambulatory Visit: Payer: Self-pay

## 2021-06-08 ENCOUNTER — Other Ambulatory Visit: Payer: BC Managed Care – PPO

## 2021-06-08 ENCOUNTER — Ambulatory Visit: Payer: BC Managed Care – PPO | Admitting: Medical

## 2021-06-08 VITALS — BP 135/94 | HR 84 | Temp 98.4°F | Resp 16 | Ht 73.0 in | Wt 181.0 lb

## 2021-06-08 DIAGNOSIS — Z0000 Encounter for general adult medical examination without abnormal findings: Secondary | ICD-10-CM

## 2021-06-08 DIAGNOSIS — R051 Acute cough: Secondary | ICD-10-CM

## 2021-06-08 DIAGNOSIS — R972 Elevated prostate specific antigen [PSA]: Secondary | ICD-10-CM

## 2021-06-08 LAB — POC COVID19 BINAXNOW: SARS Coronavirus 2 Ag: NEGATIVE

## 2021-06-08 NOTE — Progress Notes (Signed)
   Subjective:    Patient ID: Nicholas Willis, male    DOB: [birth year], 50 y.o.   MRN: [id number]  HPI  50 yo male in non acute distress.  Cough and mild congestion , Wed/ Thursday of last week, in [place].(Traveling) Returned on Saturday. Used   Cipro eye drops x 5 days, and eye improved.   Sty is almost resolved.  Waking up with it matted shut today.  New is the Coughing and congestion in the morning, improves throughout the day. Eye turned red again on Friday10/28/22.  Treated on 05/17/21 with Augmentin and  Ciloxan for skin infection conjunctivitis and skin infection around the eye.  He did home test on Saturday for Covid-19 and it was negative.  Blood pressure (!) 135/94, pulse 84, temperature 98.4 F (36.9 C), temperature source Oral, resp. rate 16, height 6\' 1"  (1.854 m), weight 181 lb (82.1 kg).    No Known Allergies  Review of Systems  Constitutional:  Negative for chills and fever.  HENT:  Positive for congestion.   Eyes:  Positive for discharge and redness. Negative for pain (annoying feeling), itching and visual disturbance (other then looking around swelling).  Respiratory:  Positive for cough. Negative for shortness of breath.   Gastrointestinal:  Negative for abdominal distention and diarrhea.  Neurological:  Negative for headaches.      Objective:   Physical Exam Constitutional:      Appearance: Normal appearance. He is normal weight.  HENT:     Head: Normocephalic and atraumatic.  Eyes:     General: Lids are normal. Vision grossly intact. Gaze aligned appropriately.        Right eye: Discharge present.     Extraocular Movements: Extraocular movements intact.     Conjunctiva/sclera:     Right eye: Right conjunctiva is injected. No chemosis, exudate or hemorrhage.    Left eye: Left conjunctiva is not injected. No chemosis, exudate or hemorrhage.    Pupils: Pupils are equal, round, and reactive to light.      Comments: Conjunctiva on the right is  erythematous Sty middle area of lower lid is almost gone.Marland Kitchen   He does not wear contacts.  Neurological:     Mental Status: He is alert.          Results for orders placed or performed in visit on 06/08/21 (from the past 24 hour(s))  POC COVID-19     Status: Normal   Collection Time: 06/08/21 10:42 AM  Result Value Ref Range   SARS Coronavirus 2 Ag Negative Negative    Assessment & Plan:  Conjunctivitis of the right eye Patient has follow up appointment with [facility] for  10:30 am today. He will keep me informed.This is his regular eye clinic. Patient verbalizes understanding and has no questions at discharge.

## 2021-06-08 NOTE — Addendum Note (Signed)
Addended by: Frederich Cha D on: 06/08/2021 10:05 AM   Modules accepted: Orders

## 2021-06-09 LAB — PSA: Prostate Specific Ag, Serum: 1.3 ng/mL (ref 0.0–4.0)

## 2021-10-06 ENCOUNTER — Other Ambulatory Visit: Payer: Self-pay | Admitting: Gastroenterology

## 2021-10-29 ENCOUNTER — Other Ambulatory Visit: Payer: Self-pay | Admitting: Gastroenterology

## 2021-11-08 ENCOUNTER — Telehealth: Payer: Self-pay

## 2021-11-08 NOTE — Telephone Encounter (Signed)
Patient is calling because she states he needs to schedule a colonoscopy and EGD ?

## 2021-11-17 NOTE — Telephone Encounter (Signed)
The patient will be set up for a screening colonoscopy and an upper endoscopy due to his GERD  ?

## 2021-11-19 ENCOUNTER — Telehealth: Payer: Self-pay

## 2021-11-19 NOTE — Telephone Encounter (Signed)
CALLED PATIENT NO ANSWER LEFT VOICEMAIL FOR A CALL BACK ? ?

## 2021-11-21 ENCOUNTER — Other Ambulatory Visit: Payer: Self-pay | Admitting: Gastroenterology

## 2021-11-22 ENCOUNTER — Encounter: Payer: Self-pay | Admitting: Medical

## 2021-11-22 ENCOUNTER — Ambulatory Visit: Payer: BC Managed Care – PPO | Admitting: Medical

## 2021-11-22 DIAGNOSIS — I10 Essential (primary) hypertension: Secondary | ICD-10-CM

## 2021-11-22 DIAGNOSIS — R0789 Other chest pain: Secondary | ICD-10-CM

## 2021-11-22 NOTE — Progress Notes (Signed)
? ?  Subjective:  ? ? Patient ID: Nicholas Willis, male    DOB: 1971-01-28, 51 y.o.   MRN: [id number] ? ?HPI ? ? 51 yo male in non acute distress with 2 day history of increased BP 160's/90's, ?Hr in the 90's. History of Hypertension and sleep apnea taking  ?HCTZ 12.5 mg daily and Losartan 50  mg daily. ?Hearing heart beat and has had some chest tightness, no pain. Similar to  2-3 years ago. ? ?2-3 years ago seen at ED for chest tightness and elevated BP. All was wnl.  ?Seen by his Cardiologist [facility]) 3 years ago with stress test that was normal. ? ? ? ? ?Review of Systems  ?Respiratory:  Positive for chest tightness. Negative for shortness of breath.   ?Cardiovascular:  Positive for palpitations ("not  irregular"). Negative for chest pain.  ? ?   ?Objective:  ? Physical Exam ? ? ?No physcial exam telemedicine appointment. ? ? ?   ?Assessment & Plan:  ?Elevated BP ?Chest tightness. ?To follow up with his Cardiologist or his PCP. ?Send MyChart message for prompt response. ?Patient verbalizes understanding and has no questions at the end of our conversation. ? ?

## 2021-11-29 NOTE — Telephone Encounter (Signed)
Patient returned your call to schedule colonoscopy. Requesting a call back. ?

## 2021-11-30 ENCOUNTER — Telehealth: Payer: Self-pay

## 2021-11-30 NOTE — Telephone Encounter (Signed)
Patient returned call, requesting call back.

## 2021-11-30 NOTE — Telephone Encounter (Signed)
CALLED PATIENT NO ANSWER LEFT VOICEMAIL FOR A CALL BACK ?Returning call ?

## 2021-11-30 NOTE — Telephone Encounter (Signed)
CALLED PATIENT NO ANSWER LEFT VOICEMAIL FOR A CALL BACK ? ?

## 2021-12-02 ENCOUNTER — Telehealth: Payer: Self-pay

## 2021-12-02 ENCOUNTER — Other Ambulatory Visit: Payer: Self-pay

## 2021-12-02 DIAGNOSIS — Z1211 Encounter for screening for malignant neoplasm of colon: Secondary | ICD-10-CM

## 2021-12-02 MED ORDER — NA SULFATE-K SULFATE-MG SULF 17.5-3.13-1.6 GM/177ML PO SOLN: 1.0000 | Freq: Once | ORAL | 0 refills | Status: AC

## 2021-12-02 NOTE — Telephone Encounter (Signed)
Patient called back and canceled colonoscopy something came up at work but he has appointment in June and wants to reschedule then and get egd at same time  called endo and canceled ? ?

## 2021-12-02 NOTE — Addendum Note (Signed)
Addended by: Eliseo Squires on: 12/02/2021 10:08 AM ? ? Modules accepted: Orders ? ?

## 2021-12-02 NOTE — Progress Notes (Addendum)
Gastroenterology Pre-Procedure Review ? ?Request Date: 12/20/2021 ?Requesting Physician: Dr. Allen Norris ? ?PATIENT REVIEW QUESTIONS: The patient responded to the following health history questions as indicated:   ? ?1. Are you having any GI issues? no ?2. Do you have a personal history of Polyps? no ?3. Do you have a family history of Colon Cancer or Polyps? no ?4. Diabetes Mellitus? no ?5. Joint replacements in the past 12 months?no ?6. Major health problems in the past 3 months?no ?7. Any artificial heart valves, MVP, or defibrillator?no ?   ?MEDICATIONS & ALLERGIES:    ?Patient reports the following regarding taking any anticoagulation/antiplatelet therapy:   ?Plavix, Coumadin, Eliquis, Xarelto, Lovenox, Pradaxa, Brilinta, or Effient? no ?Aspirin? no ? ?Patient confirms/reports the following medications:  ?Current Outpatient Medications  ?Medication Sig Dispense Refill  ? amLODipine (NORVASC) 5 MG tablet Take 5 mg by mouth daily.    ? amoxicillin-clavulanate (AUGMENTIN) 875-125 MG tablet Take 1 tablet by mouth 2 (two) times daily. 20 tablet 10  ? ciprofloxacin (CILOXAN) 0.3 % ophthalmic solution Place 1 drop into the right eye every 2 (two) hours. Administer 1 drop, every 2 hours, while awake, for 2 days. Then 1 drop, every 4 hours, while awake, for the next 5 days. 5 mL 0  ? cyclobenzaprine (FLEXERIL) 5 MG tablet Take 1 tablet (5 mg total) by mouth at bedtime. 30 tablet 1  ? Dexlansoprazole 30 MG capsule Take 1 capsule (30 mg total) by mouth daily. 30 capsule 11  ? hydrochlorothiazide (HYDRODIURIL) 12.5 MG tablet Take 12.5 mg by mouth daily.    ? losartan (COZAAR) 50 MG tablet Take 50 mg by mouth daily.    ? pantoprazole (PROTONIX) 40 MG tablet TAKE 1 TABLET BY MOUTH EVERY DAY 30 tablet 0  ? traZODone (DESYREL) 50 MG tablet Take by mouth.    ? ?No current facility-administered medications for this visit.  ? ? ?Patient confirms/reports the following allergies:  ?No Known Allergies ? ?No orders of the defined types were  placed in this encounter. ? ? ?AUTHORIZATION INFORMATION ?Primary Insurance: ?1D#: ?Group #: ? ?Secondary Insurance: ?1D#: ?Group #: ? ?SCHEDULE INFORMATION: ?Date: 12/20/2021 ?Time: ?Location:[facility] ? ?

## 2021-12-10 ENCOUNTER — Other Ambulatory Visit: Payer: BC Managed Care – PPO

## 2021-12-13 ENCOUNTER — Other Ambulatory Visit: Payer: BC Managed Care – PPO

## 2021-12-13 DIAGNOSIS — I10 Essential (primary) hypertension: Secondary | ICD-10-CM

## 2021-12-13 DIAGNOSIS — Z125 Encounter for screening for malignant neoplasm of prostate: Secondary | ICD-10-CM

## 2021-12-14 ENCOUNTER — Other Ambulatory Visit: Payer: BC Managed Care – PPO

## 2021-12-14 DIAGNOSIS — Z125 Encounter for screening for malignant neoplasm of prostate: Secondary | ICD-10-CM

## 2021-12-14 DIAGNOSIS — I10 Essential (primary) hypertension: Secondary | ICD-10-CM

## 2021-12-15 LAB — CBC WITH DIFFERENTIAL/PLATELET
Basophils Absolute: 0 10*3/uL (ref 0.0–0.2)
Basos: 1 %
EOS (ABSOLUTE): 0.1 10*3/uL (ref 0.0–0.4)
Eos: 2 %
Hematocrit: 43.5 % (ref 37.5–51.0)
Hemoglobin: 15.4 g/dL (ref 13.0–17.7)
Immature Grans (Abs): 0 10*3/uL (ref 0.0–0.1)
Immature Granulocytes: 0 %
Lymphocytes Absolute: 1.9 10*3/uL (ref 0.7–3.1)
Lymphs: 39 %
MCH: 32.6 pg (ref 26.6–33.0)
MCHC: 35.4 g/dL (ref 31.5–35.7)
MCV: 92 fL (ref 79–97)
Monocytes Absolute: 0.4 10*3/uL (ref 0.1–0.9)
Monocytes: 8 %
Neutrophils Absolute: 2.4 10*3/uL (ref 1.4–7.0)
Neutrophils: 50 %
Platelets: 193 10*3/uL (ref 150–450)
RBC: 4.72 x10E6/uL (ref 4.14–5.80)
RDW: 12.9 % (ref 11.6–15.4)
WBC: 4.8 10*3/uL (ref 3.4–10.8)

## 2021-12-15 LAB — COMPREHENSIVE METABOLIC PANEL
ALT: 30 IU/L (ref 0–44)
AST: 28 IU/L (ref 0–40)
Albumin/Globulin Ratio: 1.9 (ref 1.2–2.2)
Albumin: 4.5 g/dL (ref 3.8–4.9)
Alkaline Phosphatase: 49 IU/L (ref 44–121)
BUN/Creatinine Ratio: 11 (ref 9–20)
BUN: 8 mg/dL (ref 6–24)
Bilirubin Total: 0.7 mg/dL (ref 0.0–1.2)
CO2: 26 mmol/L (ref 20–29)
Calcium: 9 mg/dL (ref 8.7–10.2)
Chloride: 99 mmol/L (ref 96–106)
Creatinine, Ser: 0.75 mg/dL — ABNORMAL LOW (ref 0.76–1.27)
Globulin, Total: 2.4 g/dL (ref 1.5–4.5)
Glucose: 90 mg/dL (ref 70–99)
Potassium: 3.8 mmol/L (ref 3.5–5.2)
Sodium: 141 mmol/L (ref 134–144)
Total Protein: 6.9 g/dL (ref 6.0–8.5)
eGFR: 109 mL/min/{1.73_m2} (ref >59)

## 2021-12-15 LAB — LIPID PANEL
Chol/HDL Ratio: 2.7 ratio (ref 0.0–5.0)
Cholesterol, Total: 181 mg/dL (ref 100–199)
HDL: 66 mg/dL (ref >39)
LDL Chol Calc (NIH): 100 mg/dL — ABNORMAL HIGH (ref 0–99)
Triglycerides: 83 mg/dL (ref 0–149)
VLDL Cholesterol Cal: 15 mg/dL (ref 5–40)

## 2021-12-15 LAB — TSH: TSH: 1.83 u[IU]/mL (ref 0.450–4.500)

## 2021-12-15 LAB — PSA: Prostate Specific Ag, Serum: 1.5 ng/mL (ref 0.0–4.0)

## 2021-12-20 ENCOUNTER — Ambulatory Visit: Admit: 2021-12-20 | Payer: BC Managed Care – PPO | Admitting: Gastroenterology

## 2021-12-20 SURGERY — COLONOSCOPY WITH PROPOFOL
Anesthesia: Choice

## 2022-01-18 NOTE — Progress Notes (Unsigned)
    Primary Care Physician: Sofie Hartigan, MD  Primary Gastroenterologist:  Dr. Lucilla Lame  No chief complaint on file.   HPI: Nicholas Willis is a 51 y.o. male here to see me in 2019 and at that time was recommended to have an upper endoscopy and colonoscopy.  The patient had a questionable history of Barrett's esophagus and that was why the upper endoscopy was ordered and he was recommended to have a screening colonoscopy at that time. The patient never followed through with those 2 procedures and then maintain appointment to have the procedures done in April.  Something came up at work and the patient was unable to make it to those procedures and they were canceled.  The patient then made an appointment to be seen today.  Past Medical History:  Diagnosis Date   GERD (gastroesophageal reflux disease)    Rosacea     Current Outpatient Medications  Medication Sig Dispense Refill   amLODipine (NORVASC) 5 MG tablet Take 5 mg by mouth daily.     hydrochlorothiazide (HYDRODIURIL) 12.5 MG tablet Take 12.5 mg by mouth daily.     losartan (COZAAR) 50 MG tablet Take 50 mg by mouth daily.     pantoprazole (PROTONIX) 40 MG tablet TAKE 1 TABLET BY MOUTH EVERY DAY 30 tablet 0   No current facility-administered medications for this visit.    Allergies as of 01/19/2022   (No Known Allergies)    ROS:  General: Negative for anorexia, weight loss, fever, chills, fatigue, weakness. ENT: Negative for hoarseness, difficulty swallowing , nasal congestion. CV: Negative for chest pain, angina, palpitations, dyspnea on exertion, peripheral edema.  Respiratory: Negative for dyspnea at rest, dyspnea on exertion, cough, sputum, wheezing.  GI: See history of present illness. GU:  Negative for dysuria, hematuria, urinary incontinence, urinary frequency, nocturnal urination.  Endo: Negative for unusual weight change.    Physical Examination:   There were no vitals taken for this  visit.  General: Well-nourished, well-developed in no acute distress.  Eyes: No icterus. Conjunctivae pink. Lungs: Clear to auscultation bilaterally. Non-labored. Heart: Regular rate and rhythm, no murmurs rubs or gallops.  Abdomen: Bowel sounds are normal, nontender, nondistended, no hepatosplenomegaly or masses, no abdominal bruits or hernia , no rebound or guarding.   Extremities: No lower extremity edema. No clubbing or deformities. Neuro: Alert and oriented x 3.  Grossly intact. Skin: Warm and dry, no jaundice.   Psych: Alert and cooperative, normal mood and affect.  Labs:    Imaging Studies: No results found.  Assessment and Plan:   Nicholas Willis is a 51 y.o. y/o male ***     Lucilla Lame, MD. Marval Regal    Note: This dictation was prepared with Dragon dictation along with smaller phrase technology. Any transcriptional errors that result from this process are unintentional.

## 2022-01-19 ENCOUNTER — Ambulatory Visit (INDEPENDENT_AMBULATORY_CARE_PROVIDER_SITE_OTHER): Payer: BC Managed Care – PPO | Admitting: Gastroenterology

## 2022-01-19 ENCOUNTER — Encounter: Payer: Self-pay | Admitting: Gastroenterology

## 2022-01-19 VITALS — BP 147/85 | HR 84 | Temp 98.4°F | Ht 73.0 in | Wt 180.0 lb

## 2022-01-19 DIAGNOSIS — K219 Gastro-esophageal reflux disease without esophagitis: Secondary | ICD-10-CM

## 2022-01-19 DIAGNOSIS — Z1211 Encounter for screening for malignant neoplasm of colon: Secondary | ICD-10-CM

## 2022-01-19 MED ORDER — NA SULFATE-K SULFATE-MG SULF 17.5-3.13-1.6 GM/177ML PO SOLN: 1.0000 | Freq: Once | ORAL | 0 refills | Status: AC

## 2022-01-25 ENCOUNTER — Telehealth: Payer: Self-pay

## 2022-01-25 NOTE — Telephone Encounter (Signed)
Returned phone call this morning to rescheduled patients 03/15/22 [facility] Colonoscopy with Dr. Allen Norris.  He requested 08/09 (Wed) informed him this date is not available.  03/17/22 was inquired about as well.  Informed him 03/17/22 is available at [facility] with Dr. Allen Norris.  He said he will call back to let me know if he wants [place] to move location and date to 03/17/22.  Thanks,  [place], [place]

## 2022-03-07 ENCOUNTER — Telehealth: Payer: Self-pay

## 2022-03-07 NOTE — Telephone Encounter (Signed)
Mr. Osgood colonoscopy has been rescheduled to 04/07/22 with Dr. Allen Norris moved to [facility].  Lori at [facility] has been asked to drop his procedure in the depot.  Kim at [facility] has been messaged to pick up and schedule for 04/07/22.  Thanks, [place], [place]

## 2022-03-24 ENCOUNTER — Encounter: Payer: Self-pay | Admitting: Gastroenterology

## 2022-03-24 ENCOUNTER — Other Ambulatory Visit: Payer: Self-pay

## 2022-03-25 NOTE — Anesthesia Preprocedure Evaluation (Addendum)
Anesthesia Evaluation  Patient identified by MRN, date of birth, ID band Patient awake    Reviewed: Allergy & Precautions, NPO status , Patient's Chart, lab work & pertinent test results  Airway Mallampati: II  TM Distance: >3 FB Neck ROM: full    Dental no notable dental hx.    Pulmonary sleep apnea ,    Pulmonary exam normal        Cardiovascular hypertension, Pt. on medications Normal cardiovascular exam  Ascending aorta dilatation   Neuro/Psych PSYCHIATRIC DISORDERS Anxiety negative neurological ROS     GI/Hepatic Neg liver ROS, GERD  Controlled and Medicated,  Endo/Other  negative endocrine ROS  Renal/GU negative Renal ROS  negative genitourinary   Musculoskeletal   Abdominal Normal abdominal exam  (+)   Peds  Hematology negative hematology ROS (+)   Anesthesia Other Findings Past Medical History: No date: GERD (gastroesophageal reflux disease) No date: Rosacea No date: Sleep apnea  Past Surgical History: No date: ADENOIDECTOMY  BMI    Body Mass Index: 24.41 kg/m      Reproductive/Obstetrics negative OB ROS                            Anesthesia Physical Anesthesia Plan  ASA: 2  Anesthesia Plan: General   Post-op Pain Management:    Induction: Intravenous  PONV Risk Score and Plan: Propofol infusion and TIVA  Airway Management Planned: Natural Airway  Additional Equipment:   Intra-op Plan:   Post-operative Plan:   Informed Consent: I have reviewed the patients History and Physical, chart, labs and discussed the procedure including the risks, benefits and alternatives for the proposed anesthesia with the patient or authorized representative who has indicated his/her understanding and acceptance.     Dental Advisory Given  Plan Discussed with: Anesthesiologist, CRNA and Surgeon  Anesthesia Plan Comments:        Anesthesia Quick Evaluation

## 2022-03-31 ENCOUNTER — Telehealth: Payer: Self-pay | Admitting: Gastroenterology

## 2022-03-31 NOTE — Telephone Encounter (Signed)
Patient has a question about his prep for colonoscopy and upper endoscopy. Requesting call back.

## 2022-04-07 ENCOUNTER — Ambulatory Visit: Payer: BC Managed Care – PPO | Admitting: Anesthesiology

## 2022-04-07 ENCOUNTER — Encounter
Admission: RE | Disposition: A | Discharge status: Home / Self Care | Payer: Self-pay | Source: Home / Self Care | Attending: Gastroenterology

## 2022-04-07 ENCOUNTER — Encounter: Payer: Self-pay | Admitting: Gastroenterology

## 2022-04-07 ENCOUNTER — Other Ambulatory Visit: Payer: Self-pay

## 2022-04-07 ENCOUNTER — Ambulatory Visit
Admission: RE | Admit: 2022-04-07 | Discharge: 2022-04-07 | Disposition: A | Discharge status: Home / Self Care | Payer: BC Managed Care – PPO | Attending: Gastroenterology | Admitting: Gastroenterology

## 2022-04-07 DIAGNOSIS — K640 First degree hemorrhoids: Secondary | ICD-10-CM | POA: Insufficient documentation

## 2022-04-07 DIAGNOSIS — D123 Benign neoplasm of transverse colon: Secondary | ICD-10-CM | POA: Insufficient documentation

## 2022-04-07 DIAGNOSIS — Z8249 Family history of ischemic heart disease and other diseases of the circulatory system: Secondary | ICD-10-CM | POA: Insufficient documentation

## 2022-04-07 DIAGNOSIS — I10 Essential (primary) hypertension: Secondary | ICD-10-CM | POA: Diagnosis not present

## 2022-04-07 DIAGNOSIS — K5730 Diverticulosis of large intestine without perforation or abscess without bleeding: Secondary | ICD-10-CM | POA: Insufficient documentation

## 2022-04-07 DIAGNOSIS — Z79899 Other long term (current) drug therapy: Secondary | ICD-10-CM | POA: Diagnosis not present

## 2022-04-07 DIAGNOSIS — G4730 Sleep apnea, unspecified: Secondary | ICD-10-CM | POA: Insufficient documentation

## 2022-04-07 DIAGNOSIS — Z1211 Encounter for screening for malignant neoplasm of colon: Secondary | ICD-10-CM | POA: Insufficient documentation

## 2022-04-07 DIAGNOSIS — K635 Polyp of colon: Secondary | ICD-10-CM

## 2022-04-07 DIAGNOSIS — K449 Diaphragmatic hernia without obstruction or gangrene: Secondary | ICD-10-CM | POA: Insufficient documentation

## 2022-04-07 DIAGNOSIS — K219 Gastro-esophageal reflux disease without esophagitis: Secondary | ICD-10-CM | POA: Insufficient documentation

## 2022-04-07 HISTORY — PX: ESOPHAGOGASTRODUODENOSCOPY (EGD) WITH PROPOFOL: SHX5813

## 2022-04-07 HISTORY — PX: POLYPECTOMY: SHX5525

## 2022-04-07 HISTORY — PX: COLONOSCOPY WITH PROPOFOL: SHX5780

## 2022-04-07 HISTORY — DX: Sleep apnea, unspecified: G47.30

## 2022-04-07 SURGERY — COLONOSCOPY WITH PROPOFOL
Anesthesia: General | Site: Rectum

## 2022-04-07 MED ORDER — SODIUM CHLORIDE 0.9 % IV SOLN: INTRAVENOUS | Status: DC

## 2022-04-07 MED ORDER — PROPOFOL 10 MG/ML IV BOLUS
INTRAVENOUS | Status: DC | PRN
  Administered 2022-04-07 (×6): 50 mg via INTRAVENOUS

## 2022-04-07 MED ORDER — STERILE WATER FOR IRRIGATION IR SOLN
Status: DC | PRN
  Administered 2022-04-07: 100 mL

## 2022-04-07 MED ORDER — LACTATED RINGERS IV SOLN: INTRAVENOUS | Status: DC

## 2022-04-07 SURGICAL SUPPLY — 9 items
BLOCK BITE 60FR ADLT L/F GRN (MISCELLANEOUS) ×4 IMPLANT
GOWN CVR UNV OPN BCK APRN NK (MISCELLANEOUS) ×16 IMPLANT
GOWN ISOL THUMB LOOP REG UNIV (MISCELLANEOUS) ×12
KIT PRC NS LF DISP ENDO (KITS) ×6 IMPLANT
KIT PROCEDURE OLYMPUS (KITS) ×6
MANIFOLD NEPTUNE II (INSTRUMENTS) ×8 IMPLANT
SNARE COLD EXACTO (MISCELLANEOUS) IMPLANT
TRAP ETRAP POLY (MISCELLANEOUS) IMPLANT
WATER STERILE IRR 250ML POUR (IV SOLUTION) ×8 IMPLANT

## 2022-04-07 NOTE — Anesthesia Postprocedure Evaluation (Signed)
Anesthesia Post Note  Patient: Nicholas Willis  Procedure(s) Performed: COLONOSCOPY WITH BIOPSY (Rectum) POLYPECTOMY (Rectum) ESOPHAGOGASTRODUODENOSCOPY (EGD) WITH PROPOFOL (Mouth)     Patient location during evaluation: PACU Anesthesia Type: General Level of consciousness: awake and alert Pain management: pain level controlled Vital Signs Assessment: post-procedure vital signs reviewed and stable Respiratory status: spontaneous breathing, nonlabored ventilation and respiratory function stable Cardiovascular status: blood pressure returned to baseline and stable Postop Assessment: no apparent nausea or vomiting Anesthetic complications: no   No notable events documented.  [place] Ouch

## 2022-04-07 NOTE — Op Note (Signed)
[facility] Gastroenterology Patient Name: Nicholas Willis Procedure Date: 04/07/2022 9:05 AM MRN: [id number] Account #: [id number] Date of Birth: [birth year] Admit Type: Outpatient Age: 51 Room: [facility] OR ROOM 01 Gender: Male Note Status: Finalized Instrument Name: [id number] Procedure:             Colonoscopy Indications:           Screening for colorectal malignant neoplasm Providers:             Lucilla Lame MD, MD Referring MD:          Sofie Hartigan (Referring MD) Medicines:             Propofol per Anesthesia Complications:         No immediate complications. Procedure:             Pre-Anesthesia Assessment:                        - Prior to the procedure, a History and Physical was                         performed, and patient medications and allergies were                         reviewed. The patient's tolerance of previous                         anesthesia was also reviewed. The risks and benefits                         of the procedure and the sedation options and risks                         were discussed with the patient. All questions were                         answered, and informed consent was obtained. Prior                         Anticoagulants: The patient has taken no previous                         anticoagulant or antiplatelet agents. ASA Grade                         Assessment: II - A patient with mild systemic disease.                         After reviewing the risks and benefits, the patient                         was deemed in satisfactory condition to undergo the                         procedure.                        After obtaining informed consent, the colonoscope was  passed under direct vision. Throughout the procedure,                         the patient's blood pressure, pulse, and oxygen                         saturations were monitored continuously. The was                          introduced through the anus and advanced to the the                         cecum, identified by appendiceal orifice and ileocecal                         valve. The colonoscopy was performed without                         difficulty. The patient tolerated the procedure well.                         The quality of the bowel preparation was excellent. Findings:      The perianal and digital rectal examinations were normal.      A 5 mm polyp was found in the transverse colon. The polyp was sessile.       The polyp was removed with a cold snare. Resection and retrieval were       complete.      A few small-mouthed diverticula were found in the sigmoid colon.      Non-bleeding internal hemorrhoids were found during retroflexion. The       hemorrhoids were Grade I (internal hemorrhoids that do not prolapse). Impression:            - One 5 mm polyp in the transverse colon, removed with                         a cold snare. Resected and retrieved.                        - Diverticulosis in the sigmoid colon.                        - Non-bleeding internal hemorrhoids. Recommendation:        - If the pathology report reveals adenomatous tissue,                         then repeat the colonoscopy for surveillance in 7                         years. Procedure Code(s):     --- Professional ---                        [phone], Colonoscopy, flexible; with removal of                         tumor(s), polyp(s), or other lesion(s) by snare  technique Diagnosis Code(s):     --- Professional ---                        Z12.11, Encounter for screening for malignant neoplasm                         of colon                        K63.5, Polyp of colon CPT copyright 2019 American Medical Association. All rights reserved. The codes documented in this report are preliminary and upon coder review may  be revised to meet current compliance requirements. Lucilla Lame MD, MD 04/07/2022 9:35:53  AM This report has been signed electronically. Number of Addenda: 0 Note Initiated On: 04/07/2022 9:05 AM Scope Withdrawal Time: 0 hours 6 minutes 36 seconds  Total Procedure Duration: 0 hours 9 minutes 37 seconds  Estimated Blood Loss:  Estimated blood loss: none.      [facility]

## 2022-04-07 NOTE — Op Note (Signed)
[facility] Patient Name: Nicholas Willis Procedure Date: 04/07/2022 9:06 AM MRN: [id number] Account #: [id number] Date of Birth: February 02, 1971 Admit Type: Outpatient Age: 51 Room: [facility] OR ROOM 1 Gender: Male Note Status: Finalized Instrument Name: [id number] Procedure:             Upper GI endoscopy Indications:           Heartburn Providers:             Lucilla Lame MD, MD Referring MD:          Sofie Hartigan (Referring MD) Medicines:             Propofol per Anesthesia Complications:         No immediate complications. Procedure:             Pre-Anesthesia Assessment:                        - Prior to the procedure, a History and Physical was                         performed, and patient medications and allergies were                         reviewed. The patient's tolerance of previous                         anesthesia was also reviewed. The risks and benefits                         of the procedure and the sedation options and risks                         were discussed with the patient. All questions were                         answered, and informed consent was obtained. Prior                         Anticoagulants: The patient has taken no previous                         anticoagulant or antiplatelet agents. ASA Grade                         Assessment: II - A patient with mild systemic disease.                         After reviewing the risks and benefits, the patient                         was deemed in satisfactory condition to undergo the                         procedure.                        After obtaining informed consent, the endoscope was  passed under direct vision. Throughout the procedure,                         the patient's blood pressure, pulse, and oxygen                         saturations were monitored continuously. The Endoscope                         was introduced through the  mouth, and advanced to the                         second part of duodenum. The upper GI endoscopy was                         accomplished without difficulty. The patient tolerated                         the procedure well. Findings:      A small hiatal hernia was present.      The entire examined stomach was normal.      The examined duodenum was normal. Impression:            - Small hiatal hernia.                        - Normal stomach.                        - Normal examined duodenum.                        - No specimens collected. Recommendation:        - Discharge patient to home.                        - Resume previous diet.                        - Continue present medications.                        - Perform a colonoscopy today. Procedure Code(s):     --- Professional ---                        [phone], Esophagogastroduodenoscopy, flexible,                         transoral; diagnostic, including collection of                         specimen(s) by brushing or washing, when performed                         (separate procedure) Diagnosis Code(s):     --- Professional ---                        R12, Heartburn CPT copyright 2019 American Medical Association. All rights reserved. The codes documented in this report are preliminary and upon coder review may  be revised to meet current compliance  requirements. Lucilla Lame MD, MD 04/07/2022 9:20:04 AM This report has been signed electronically. Number of Addenda: 0 Note Initiated On: 04/07/2022 9:06 AM Total Procedure Duration: 0 hours 1 minute 21 seconds  Estimated Blood Loss:  Estimated blood loss: none.      [facility]

## 2022-04-07 NOTE — Anesthesia Procedure Notes (Signed)
Procedure Name: MAC Date/Time: 04/07/2022 9:10 AM  Performed by: Griffin Dakin, CRNAPre-anesthesia Checklist: Patient identified, Emergency Drugs available, Suction available, Patient being monitored and Timeout performed Patient Re-evaluated:Patient Re-evaluated prior to induction Oxygen Delivery Method: Nasal cannula Induction Type: IV induction Placement Confirmation: positive ETCO2 and breath sounds checked- equal and bilateral Dental Injury: Teeth and Oropharynx as per pre-operative assessment

## 2022-04-07 NOTE — H&P (Signed)
   Lucilla Lame, MD [address] [place], [place] [address] Phone:[phone] Fax : [phone]  Primary Care Physician:  Sofie Hartigan, MD Primary Gastroenterologist:  Dr. Allen Norris  Pre-Procedure History & Physical: HPI:  Nicholas Willis is a 51 y.o. male is here for an endoscopy and colonoscopy.   Past Medical History:  Diagnosis Date   GERD (gastroesophageal reflux disease)    Rosacea    Sleep apnea     Past Surgical History:  Procedure Laterality Date   ADENOIDECTOMY      Prior to Admission medications   Medication Sig Start Date End Date Taking? Authorizing Provider  amLODipine (NORVASC) 5 MG tablet Take 5 mg by mouth daily. 02/17/20  Yes [provider]  hydrochlorothiazide (HYDRODIURIL) 12.5 MG tablet Take 12.5 mg by mouth daily.   Yes [provider]  losartan (COZAAR) 50 MG tablet Take 50 mg by mouth daily.   Yes [provider]  pantoprazole (PROTONIX) 40 MG tablet TAKE 1 TABLET BY MOUTH EVERY DAY 10/06/21  Yes Lucilla Lame, MD    Allergies as of 01/19/2022   (No Known Allergies)    Family History  Problem Relation Age of Onset   Hypertension Mother    GER disease Father     Social History   Socioeconomic History   Marital status: Divorced    Spouse name: Not on file   Number of children: Not on file   Years of education: Not on file   Highest education level: Not on file  Occupational History   Not on file  Tobacco Use   Smoking status: Never   Smokeless tobacco: Never  Substance and Sexual Activity   Alcohol use: Yes    Alcohol/week: 14.0 standard drinks of alcohol    Types: 14 Glasses of wine per week   Drug use: No   Sexual activity: Not on file  Other Topics Concern   Not on file  Social History Narrative   Not on file   Social Determinants of Health   Financial Resource Strain: Not on file  Food Insecurity: Not on file  Transportation Needs: Not on file  Physical Activity: Not on file   Stress: Not on file  Social Connections: Not on file  Intimate Partner Violence: Not on file    Review of Systems: See HPI, otherwise negative ROS  Physical Exam: BP (!) 140/91   Pulse 90   Temp 98.8 F (37.1 C) (Temporal)   Ht 6' (1.829 m)   Wt 79.1 kg   SpO2 100%   BMI 23.65 kg/m  General:   Alert,  pleasant and cooperative in NAD Head:  Normocephalic and atraumatic. Neck:  Supple; no masses or thyromegaly. Lungs:  Clear throughout to auscultation.    Heart:  Regular rate and rhythm. Abdomen:  Soft, nontender and nondistended. Normal bowel sounds, without guarding, and without rebound.   Neurologic:  Alert and  oriented x4;  grossly normal neurologically.  Impression/Plan: Nicholas Willis is here for an endoscopy and colonoscopy to be performed for GERD and screening  Risks, benefits, limitations, and alternatives regarding  endoscopy and colonoscopy have been reviewed with the patient.  Questions have been answered.  All parties agreeable.   Lucilla Lame, MD  04/07/2022, 9:04 AM

## 2022-04-07 NOTE — Transfer of Care (Signed)
Immediate Anesthesia Transfer of Care Note  Patient: Nicholas Willis  Procedure(s) Performed: COLONOSCOPY WITH BIOPSY (Rectum) POLYPECTOMY (Rectum) ESOPHAGOGASTRODUODENOSCOPY (EGD) WITH PROPOFOL (Mouth)  Patient Location: PACU  Anesthesia Type: General  Level of Consciousness: awake, alert  and patient cooperative  Airway and Oxygen Therapy: Patient Spontanous Breathing and Patient connected to supplemental oxygen  Post-op Assessment: Post-op Vital signs reviewed, Patient's Cardiovascular Status Stable, Respiratory Function Stable, Patent Airway and No signs of Nausea or vomiting  Post-op Vital Signs: Reviewed and stable  Complications: No notable events documented.

## 2022-04-08 LAB — SURGICAL PATHOLOGY

## 2022-04-12 ENCOUNTER — Encounter: Payer: Self-pay | Admitting: Gastroenterology

## 2023-01-24 ENCOUNTER — Other Ambulatory Visit: Payer: Self-pay

## 2023-01-24 DIAGNOSIS — I10 Essential (primary) hypertension: Secondary | ICD-10-CM

## 2023-01-24 DIAGNOSIS — Z125 Encounter for screening for malignant neoplasm of prostate: Secondary | ICD-10-CM

## 2023-01-25 ENCOUNTER — Other Ambulatory Visit: Payer: Self-pay

## 2023-01-25 DIAGNOSIS — I10 Essential (primary) hypertension: Secondary | ICD-10-CM

## 2023-01-25 DIAGNOSIS — Z125 Encounter for screening for malignant neoplasm of prostate: Secondary | ICD-10-CM

## 2023-01-26 LAB — PSA, TOTAL AND FREE
PSA, Free Pct: 49.1 %
PSA, Free: 0.54 ng/mL
Prostate Specific Ag, Serum: 1.1 ng/mL (ref 0.0–4.0)

## 2023-01-26 LAB — CBC WITH DIFFERENTIAL/PLATELET
Basophils Absolute: 0 10*3/uL (ref 0.0–0.2)
Basos: 1 %
EOS (ABSOLUTE): 0.1 10*3/uL (ref 0.0–0.4)
Eos: 2 %
Hematocrit: 41.2 % (ref 37.5–51.0)
Hemoglobin: 14.4 g/dL (ref 13.0–17.7)
Immature Grans (Abs): 0 10*3/uL (ref 0.0–0.1)
Immature Granulocytes: 0 %
Lymphocytes Absolute: 1.8 10*3/uL (ref 0.7–3.1)
Lymphs: 38 %
MCH: 32.9 pg (ref 26.6–33.0)
MCHC: 35 g/dL (ref 31.5–35.7)
MCV: 94 fL (ref 79–97)
Monocytes Absolute: 0.4 10*3/uL (ref 0.1–0.9)
Monocytes: 7 %
Neutrophils Absolute: 2.5 10*3/uL (ref 1.4–7.0)
Neutrophils: 52 %
Platelets: 192 10*3/uL (ref 150–450)
RBC: 4.38 x10E6/uL (ref 4.14–5.80)
RDW: 12.4 % (ref 11.6–15.4)
WBC: 4.7 10*3/uL (ref 3.4–10.8)

## 2023-01-26 LAB — COMPREHENSIVE METABOLIC PANEL
ALT: 55 IU/L — ABNORMAL HIGH (ref 0–44)
AST: 40 IU/L (ref 0–40)
Albumin: 4.4 g/dL (ref 3.8–4.9)
Alkaline Phosphatase: 51 IU/L (ref 44–121)
BUN/Creatinine Ratio: 13 (ref 9–20)
BUN: 11 mg/dL (ref 6–24)
Bilirubin Total: 1.1 mg/dL (ref 0.0–1.2)
CO2: 25 mmol/L (ref 20–29)
Calcium: 9 mg/dL (ref 8.7–10.2)
Chloride: 100 mmol/L (ref 96–106)
Creatinine, Ser: 0.88 mg/dL (ref 0.76–1.27)
Globulin, Total: 2.1 g/dL (ref 1.5–4.5)
Glucose: 95 mg/dL (ref 70–99)
Potassium: 3.8 mmol/L (ref 3.5–5.2)
Sodium: 139 mmol/L (ref 134–144)
Total Protein: 6.5 g/dL (ref 6.0–8.5)
eGFR: 103 mL/min/{1.73_m2} (ref >59)

## 2023-01-26 LAB — TSH: TSH: 1.39 u[IU]/mL (ref 0.450–4.500)

## 2023-01-26 LAB — LIPID PANEL WITH LDL/HDL RATIO
Cholesterol, Total: 164 mg/dL (ref 100–199)
HDL: 68 mg/dL (ref >39)
LDL Chol Calc (NIH): 85 mg/dL (ref 0–99)
LDL/HDL Ratio: 1.3 ratio (ref 0.0–3.6)
Triglycerides: 53 mg/dL (ref 0–149)
VLDL Cholesterol Cal: 11 mg/dL (ref 5–40)

## 2023-05-16 ENCOUNTER — Other Ambulatory Visit: Payer: Self-pay

## 2023-05-16 DIAGNOSIS — R748 Abnormal levels of other serum enzymes: Secondary | ICD-10-CM

## 2023-05-16 DIAGNOSIS — Z0000 Encounter for general adult medical examination without abnormal findings: Secondary | ICD-10-CM

## 2023-05-22 ENCOUNTER — Other Ambulatory Visit: Payer: Self-pay

## 2023-05-23 ENCOUNTER — Other Ambulatory Visit: Payer: Self-pay

## 2023-05-23 DIAGNOSIS — Z0000 Encounter for general adult medical examination without abnormal findings: Secondary | ICD-10-CM

## 2023-05-23 DIAGNOSIS — R748 Abnormal levels of other serum enzymes: Secondary | ICD-10-CM

## 2023-05-25 LAB — HEPATIC FUNCTION PANEL
ALT: 30 [IU]/L (ref 0–44)
AST: 31 [IU]/L (ref 0–40)
Albumin: 4.3 g/dL (ref 3.8–4.9)
Alkaline Phosphatase: 48 [IU]/L (ref 44–121)
Bilirubin Total: 0.7 mg/dL (ref 0.0–1.2)
Bilirubin, Direct: 0.18 mg/dL (ref 0.00–0.40)
Total Protein: 6.5 g/dL (ref 6.0–8.5)

## 2023-09-07 ENCOUNTER — Ambulatory Visit: Payer: Self-pay | Admitting: Adult Health

## 2024-01-26 ENCOUNTER — Other Ambulatory Visit: Payer: Self-pay

## 2024-01-26 DIAGNOSIS — Z125 Encounter for screening for malignant neoplasm of prostate: Secondary | ICD-10-CM

## 2024-01-26 DIAGNOSIS — I10 Essential (primary) hypertension: Secondary | ICD-10-CM

## 2024-01-26 NOTE — Progress Notes (Signed)
 cbc

## 2024-01-29 ENCOUNTER — Other Ambulatory Visit: Payer: Self-pay

## 2024-01-29 DIAGNOSIS — Z125 Encounter for screening for malignant neoplasm of prostate: Secondary | ICD-10-CM

## 2024-01-29 DIAGNOSIS — I10 Essential (primary) hypertension: Secondary | ICD-10-CM

## 2024-01-30 LAB — CBC WITH DIFFERENTIAL/PLATELET
Basophils Absolute: 0 10*3/uL (ref 0.0–0.2)
Basos: 1 %
EOS (ABSOLUTE): 0.1 10*3/uL (ref 0.0–0.4)
Eos: 2 %
Hematocrit: 43.7 % (ref 37.5–51.0)
Hemoglobin: 14.2 g/dL (ref 13.0–17.7)
Immature Grans (Abs): 0 10*3/uL (ref 0.0–0.1)
Immature Granulocytes: 0 %
Lymphocytes Absolute: 2.3 10*3/uL (ref 0.7–3.1)
Lymphs: 34 %
MCH: 32.7 pg (ref 26.6–33.0)
MCHC: 32.5 g/dL (ref 31.5–35.7)
MCV: 101 fL — ABNORMAL HIGH (ref 79–97)
Monocytes Absolute: 0.6 10*3/uL (ref 0.1–0.9)
Monocytes: 8 %
Neutrophils Absolute: 3.7 10*3/uL (ref 1.4–7.0)
Neutrophils: 55 %
Platelets: 183 10*3/uL (ref 150–450)
RBC: 4.34 x10E6/uL (ref 4.14–5.80)
RDW: 11.9 % (ref 11.6–15.4)
WBC: 6.7 10*3/uL (ref 3.4–10.8)

## 2024-01-30 LAB — COMPREHENSIVE METABOLIC PANEL WITH GFR
ALT: 26 IU/L (ref 0–44)
AST: 27 IU/L (ref 0–40)
Albumin: 4.5 g/dL (ref 3.8–4.9)
Alkaline Phosphatase: 54 IU/L (ref 44–121)
BUN/Creatinine Ratio: 12 (ref 9–20)
BUN: 10 mg/dL (ref 6–24)
Bilirubin Total: 1.2 mg/dL (ref 0.0–1.2)
CO2: 23 mmol/L (ref 20–29)
Calcium: 9.5 mg/dL (ref 8.7–10.2)
Chloride: 100 mmol/L (ref 96–106)
Creatinine, Ser: 0.81 mg/dL (ref 0.76–1.27)
Globulin, Total: 2.1 g/dL (ref 1.5–4.5)
Glucose: 86 mg/dL (ref 70–99)
Potassium: 3.7 mmol/L (ref 3.5–5.2)
Sodium: 139 mmol/L (ref 134–144)
Total Protein: 6.6 g/dL (ref 6.0–8.5)
eGFR: 105 mL/min/{1.73_m2} (ref >59)

## 2024-01-30 LAB — PSA, TOTAL AND FREE
PSA, Free Pct: 43.3 %
PSA, Free: 1.3 ng/mL
Prostate Specific Ag, Serum: 3 ng/mL (ref 0.0–4.0)

## 2024-01-30 LAB — TSH: TSH: 1.65 u[IU]/mL (ref 0.450–4.500)

## 2024-01-30 LAB — LIPID PANEL
Chol/HDL Ratio: 2.5 ratio (ref 0.0–5.0)
Cholesterol, Total: 171 mg/dL (ref 100–199)
HDL: 68 mg/dL (ref >39)
LDL Chol Calc (NIH): 91 mg/dL (ref 0–99)
Triglycerides: 62 mg/dL (ref 0–149)
VLDL Cholesterol Cal: 12 mg/dL (ref 5–40)

## 2024-04-09 ENCOUNTER — Other Ambulatory Visit: Payer: Self-pay

## 2024-04-09 DIAGNOSIS — R972 Elevated prostate specific antigen [PSA]: Secondary | ICD-10-CM

## 2024-04-09 NOTE — Addendum Note (Signed)
 Addended by: NEWCOMER MCCLAIN, Biagio Snelson L on: 04/09/2024 03:35 PM   Modules accepted: Orders

## 2024-04-10 ENCOUNTER — Other Ambulatory Visit: Payer: Self-pay

## 2024-04-10 DIAGNOSIS — R972 Elevated prostate specific antigen [PSA]: Secondary | ICD-10-CM

## 2024-04-11 LAB — PSA: Prostate Specific Ag, Serum: 1.3 ng/mL (ref 0.0–4.0)

## 2024-07-01 ENCOUNTER — Encounter: Payer: Self-pay | Admitting: Medical

## 2024-07-01 ENCOUNTER — Ambulatory Visit: Payer: Self-pay | Admitting: Medical

## 2024-07-01 ENCOUNTER — Other Ambulatory Visit: Payer: Self-pay

## 2024-07-01 VITALS — BP 132/82 | HR 89 | Temp 98.4°F

## 2024-07-01 DIAGNOSIS — R0789 Other chest pain: Secondary | ICD-10-CM

## 2024-07-01 DIAGNOSIS — R052 Subacute cough: Secondary | ICD-10-CM

## 2024-07-01 DIAGNOSIS — I10 Essential (primary) hypertension: Secondary | ICD-10-CM

## 2024-07-01 MED ORDER — BENZONATATE 100 MG PO CAPS: ORAL_CAPSULE | ORAL | 0 refills | Status: AC

## 2024-07-01 NOTE — Progress Notes (Signed)
 [facility] [address] [place], [place] [address]   Office Visit Note  Patient Name: Nicholas Willis Date of Birth [id number]  Medical Record number [id number]  Date of Service: 07/01/2024  Chief Complaint  Patient presents with   Acute Visit    States he has been traveling a lot and has had this continuous cough for about 4 weeks he stated, states he can go a good amount of time without coughing then he will hve fits, states that he felt that his blood pressure was elevated (takes medication) over the weekend      HPI Discussed the use of AI scribe software for clinical note transcription with the patient, who gave verbal consent to proceed.  History of Present Illness CONSTANTINOS KREMPASKY is a 53 year old male with hypertension, GERD and obstructive sleep apnea who presents with a persistent cough and concerns about blood pressure fluctuations.  Cough and upper respiratory symptoms - Persistent tickle cough for four weeks - Cough is nonproductive, deep, and primarily located in the throat, described as a 'frog tickle' - No nocturnal occurrence - Cough lacks a consistent pattern, sometimes has prolonged coughing fits - Cough drops provide some relief - Delsym used without significant improvement - No fever, sore throat, shortness of breath, or significant mucus production - Occasional morning nasal congestion attributed to CPAP machine - No recent allergies or environmental triggers  Blood pressure fluctuations and chest discomfort - Hypertension with recent fluctuations in blood pressure over the past few weeks - Blood pressure readings as high as 160/99 mmHg (before taking BP meds in AM) - Takes losartan, hydrochlorothiazide and amlodipine , stable regimen for five years - Experiences intermittent tightness in anterior chest, sometimes radiating to back and shoulder (never simultaneously), has occurred recently, not generally associated with BP  elevation - No severe or persistent chest pain. Denies shortness of breath. Sometimes has awareness of heartbeat but denies skipping/irregular beats or rapid HR. - No nausea, vomiting, dizziness or sweating associated with chest discomfort -No vision changes or headaches.  Cardiac risk factors and history - Family history of cardiac issues (grandfather), not in parents - Previous stress test was normal, performed about five years ago - Has seen cardiology previously for atypical chest pain, considering scheduling follow up  Sleep and reflux symptoms - Regular use of CPAP machine without issues - No significant reflux symptoms recently  Lifestyle and recent changes - No recent changes in diet, stress levels, or physical activity - Frequent travel recently, mostly to [place]  and [place] - Does not smoke or vape.   Current Medication:  Outpatient Encounter Medications as of 07/01/2024  Medication Sig   amLODipine  (NORVASC ) 5 MG tablet Take 5 mg by mouth daily.   hydrochlorothiazide (HYDRODIURIL) 12.5 MG tablet Take 12.5 mg by mouth daily.   losartan (COZAAR) 50 MG tablet Take 50 mg by mouth daily.   pantoprazole  (PROTONIX ) 40 MG tablet TAKE 1 TABLET BY MOUTH EVERY DAY   No facility-administered encounter medications on file as of 07/01/2024.      Medical History: Past Medical History:  Diagnosis Date   GERD (gastroesophageal reflux disease)    Hypertension    Rosacea    Sleep apnea      Vital Signs: BP 132/82   Pulse 89   Temp 98.4 F (36.9 C) (Tympanic)   SpO2 98%    Review of Systems See HPI  Physical Exam Vitals reviewed.  Constitutional:      General:  He is not in acute distress.    Appearance: He is not ill-appearing.  HENT:     Head: Normocephalic.     Mouth/Throat:     Mouth: Mucous membranes are moist.     Pharynx: Oropharynx is clear. No pharyngeal swelling or posterior oropharyngeal erythema.  Cardiovascular:     Rate and Rhythm: Normal rate  and regular rhythm.     Heart sounds: No murmur heard.    No friction rub. No gallop.  Pulmonary:     Effort: Pulmonary effort is normal. No respiratory distress.     Breath sounds: Normal breath sounds. No wheezing, rhonchi or rales.     Comments: No cough observed during visit. Musculoskeletal:     Cervical back: Neck supple. No rigidity.     Right lower leg: No edema.     Left lower leg: No edema.  Lymphadenopathy:     Cervical: No cervical adenopathy.  Neurological:     Mental Status: He is alert and oriented to person, place, and time.     Assessment/Plan: 1. Subacute cough (Primary) - benzonatate  (TESSALON ) 100 MG capsule; One or two capsules by mouth every 8 hours as needed for cough  Dispense: 30 capsule; Refill: 0  2. Essential hypertension 3. Chest discomfort Assessment & Plan Subacute cough Persistent nonproductive cough for four weeks, perhaps self-perpetuating due to throat irritation. Lung exam reassuring, no significant infection suspected. Differential includes post-nasal drip, medication side effect (ARB) and GERD.  - Prescribed benzonatate  for cough suppression. - Advised hydration and use of cough drops. - Recommended honey to coat throat, in tea or on its own. - Advised to follow up with new/worsening sx (i.e. fever, shortness of breath) or if cough not improving over next 5-7 days.  Chest discomfort Intermittent chest tightness/discomfort. Low suspicion for acute cardiac condition. - Advised follow-up with primary care for routine check. Consider scheduling follow up with cardiology to consider potential stress test. - Instructed to monitor for worsening symptoms or new symptoms such as shortness of breath or radiating pain. Go to ED with severe or rapidly worsening chest pain or shortness of breath.  Hypertension Elevated blood pressure readings, possibly transient due to coughing. No signs of acute hypertensive crisis. BO only slightly elevated today. -  Continue current antihypertensive regimen. - Limit salt intake - Monitor blood pressure regularly. - Advised follow-up with primary care if BP continues to be elevated over next week.   General Counseling: [email or web address] venable understanding of the findings of todays visit and plan of treatment. he has been encouraged to call the office with any questions or concerns that should arise related to todays visit.    Time spent:30 Minutes    Joen Arts PA-C Physician Assistant

## 2024-07-01 NOTE — Patient Instructions (Addendum)
-  Try Benzonatate  1 or 2 capsules every 8 hours as needed for cough.  -Rest and stay well hydrated (by drinking water  and other liquids). -For your cough, use cough drops/throat lozenges and/or drink warm liquids (like tea with honey). -Send MyChart message to provider or schedule return visit as needed for new/worsening symptoms (i.e. shortness of breath, fever) or if cough does not improve as discussed with recommended treatment over next 5-7 days.  -Continue to monitor your blood pressure and chest discomfort. -Continue taking your blood pressure medicines as prescribed. -Limit your salt intake. Get regular physical activity. -Keep follow-up appointment with your primary care. Consider scheduling follow up with cardiology to consider potential stress test. Request earlier appointment with primary care if blood pressure continues to be elevated or chest discomfort changes/becomes more frequent. -Go to the emergency department with severe or rapidly worsening chest pain, shortness of breath or severe headache/vision changes associated with high blood pressure.

## 2024-07-15 ENCOUNTER — Encounter: Payer: Self-pay | Admitting: Medical

## 2024-07-15 ENCOUNTER — Ambulatory Visit: Payer: Self-pay | Admitting: Medical

## 2024-07-15 VITALS — BP 150/94 | HR 97 | Temp 98.0°F | Ht 72.0 in | Wt 180.0 lb

## 2024-07-15 DIAGNOSIS — R0789 Other chest pain: Secondary | ICD-10-CM

## 2024-07-15 DIAGNOSIS — R052 Subacute cough: Secondary | ICD-10-CM

## 2024-07-15 NOTE — Patient Instructions (Addendum)
-  Get chest xray soon. If you have not received the result after 3-4 days, please call us  to follow up. -Schedule an appointment for a blood draw later this week when it is convenient. -You will receive a MyChart message notifying you of your lab and xray results when they are available.   - Increase Benzonatate  to twice daily for now to further limit cough. Then. begin to taper off once cough seems controlled and chest pain improves. - Add Flonase  nasal spray, two sprays in each nostril once daily for two weeks. -Try a heating pad for chest wall pain, 15-20 minutes at a time on low/medium setting. - May use over-the-counter pain medication as needed for chest pain.  -Send MyChart message to provider or schedule return visit as needed for new/worsening symptoms or if symptoms do not improve as discussed with recommended treatment.

## 2024-07-15 NOTE — Progress Notes (Signed)
 [facility] [address] [place], [place] [address]   Office Visit Note  Patient Name: Nicholas Willis Date of Birth [id number]  Medical Record number [id number]  Date of Service: 07/15/2024  Chief Complaint  Patient presents with   Cough    Cough has improved but he is having pain under his left breast from coughing so hard.      HPI Discussed the use of AI scribe software for clinical note transcription with the patient, who gave verbal consent to proceed.  History of Present Illness Nicholas Willis is a 53 year old male who presents with a persistent cough and new onset chest wall pain.  See note from 07/01/24 for additional hx.  Cough - Persistent for over four weeks - Significant improvement recently using Benzonatate . - Remains dry and non-productive - Occurs more frequently during the day - Does not disrupt sleep - No coughing during CPAP use at night - Benzonatate  taken once daily in the evening, which helps calm the cough - No recent illness, fever, chills, or sweats - Occasional lethargy and fatigue, but able to function as usual  Chest pain - New onset, started four to five days ago - Coincided with improvement in cough - Located to the left lower anterior chest, between the ribs - Elicited by coughing and certain movements of trunk. - Described as significant discomfort, especially given high pain tolerance - Deep breathing does not typically cause pain - No recent injuries or trauma to the area  Hypertension - Managed with losartan, hydrochlorothiazide and Amlodipine  - Occasional spikes in blood pressure, most recent reading 145/86 - Attributes recent elevation to ongoing cough and discomfort - No shortness of breath, leg swelling, or worsening reflux symptoms  Upper respiratory symptoms - No significant nasal congestion, sneezing, or sore throat - Occasional slight runny nose and morning throat mucus - No significant  changes in usual allergy symptoms, which have improved since age 50  -Has upcoming follow up with PCP in January.  -Arranging for F/U with cardiologist as well.     Current Medication:  Outpatient Encounter Medications as of 07/15/2024  Medication Sig   amLODipine  (NORVASC ) 5 MG tablet Take 5 mg by mouth daily.   benzonatate  (TESSALON ) 100 MG capsule One or two capsules by mouth every 8 hours as needed for cough   hydrochlorothiazide (HYDRODIURIL) 12.5 MG tablet Take 12.5 mg by mouth daily.   losartan (COZAAR) 50 MG tablet Take 50 mg by mouth daily.   pantoprazole  (PROTONIX ) 40 MG tablet TAKE 1 TABLET BY MOUTH EVERY DAY   No facility-administered encounter medications on file as of 07/15/2024.      Medical History: Past Medical History:  Diagnosis Date   GERD (gastroesophageal reflux disease)    Hypertension    Rosacea    Sleep apnea      Vital Signs: BP (!) 150/94   Pulse 97   Temp 98 F (36.7 C)   Ht 6' (1.829 m)   Wt 180 lb (81.6 kg)   SpO2 99%   BMI 24.41 kg/m    Review of Systems  Constitutional:  Positive for fatigue. Negative for chills and fever.  HENT:  Positive for postnasal drip (in AM) and rhinorrhea (in AM). Negative for congestion, sneezing and sore throat.   Respiratory:  Positive for cough. Negative for chest tightness and shortness of breath.   Cardiovascular:  Positive for chest pain (see HPI). Negative for palpitations and leg swelling.  Physical Exam Vitals reviewed.  Constitutional:      General: He is not in acute distress.    Appearance: He is not ill-appearing.  HENT:     Head: Normocephalic.     Nose: No mucosal edema, congestion or rhinorrhea.     Right Turbinates: Swollen (slight).     Left Turbinates: Swollen (slight).     Mouth/Throat:     Mouth: Mucous membranes are moist. No oral lesions.     Pharynx: No pharyngeal swelling or posterior oropharyngeal erythema.     Tonsils: No tonsillar exudate.  Cardiovascular:      Rate and Rhythm: Normal rate and regular rhythm.     Heart sounds: No murmur heard.    No friction rub. No gallop.  Pulmonary:     Effort: Pulmonary effort is normal.     Breath sounds: Normal breath sounds. No wheezing, rhonchi or rales.  Chest:     Chest wall: Tenderness (mild to left lower anterior chest wall, ~6th/7th rib) present. No mass, deformity or swelling.     Comments: No bruising or discoloration. Musculoskeletal:     Cervical back: Neck supple. No rigidity.     Right lower leg: No edema.     Left lower leg: No edema.  Lymphadenopathy:     Cervical: No cervical adenopathy.  Skin:    General: Skin is warm and dry.  Neurological:     Mental Status: He is alert.     Assessment/Plan: 1. Subacute cough (Primary) 2. Left-sided chest wall pain - DG Chest 2 View; Future - CBC with Differential/Platelet; Future  Assessment & Plan Subacute cough Persisting dry cough for over four weeks, suspect due to post-nasal drip. Adverse effect from Losartan seems less likely. Cough has improved recently with Benzonatate . Discussed reassuring lung exam.  - May increased Benzonatate  to twice daily, then begin to taper off when cough seems controlled. - Add Flonase  nasal spray, two sprays in each nostril once daily for two weeks. - Discussed chest xray and blood count for peace of mind, patient in favor of this and I feel it is reasonable given persistent cough.  Left-sided chest wall pain Intermittent localized pain likely muscle strain related to coughing. Rib fracture seems less likely - Encouraged heating pad for chest wall pain, 15-20 minutes at a time on low/medium setting. - May use over-the-counter pain medication as needed.  Essential hypertension Blood pressure at 145/86. Recent fluctuations possibly due to coughing and pain. - Continue current antihypertensive regimen. - Keep upcoming follow up visit with PCP.       General Counseling: sallie maker  understanding of the findings of todays visit and plan of treatment. he has been encouraged to call the office with any questions or concerns that should arise related to todays visit.    Time spent:20 Minutes    Joen Arts PA-C Physician Assistant

## 2024-07-17 ENCOUNTER — Ambulatory Visit
Admission: RE | Admit: 2024-07-17 | Discharge: 2024-07-17 | Disposition: A | Discharge status: Home / Self Care | Source: Ambulatory Visit | Attending: Medical | Admitting: Medical

## 2024-07-17 ENCOUNTER — Other Ambulatory Visit: Payer: Self-pay

## 2024-07-17 DIAGNOSIS — R052 Subacute cough: Secondary | ICD-10-CM

## 2024-07-17 DIAGNOSIS — R0789 Other chest pain: Secondary | ICD-10-CM

## 2024-07-18 LAB — CBC WITH DIFFERENTIAL/PLATELET
Basophils Absolute: 0.1 x10E3/uL (ref 0.0–0.2)
Basos: 1 %
EOS (ABSOLUTE): 0.1 x10E3/uL (ref 0.0–0.4)
Eos: 1 %
Hematocrit: 44.9 % (ref 37.5–51.0)
Hemoglobin: 15.5 g/dL (ref 13.0–17.7)
Immature Grans (Abs): 0 x10E3/uL (ref 0.0–0.1)
Immature Granulocytes: 0 %
Lymphocytes Absolute: 1.4 x10E3/uL (ref 0.7–3.1)
Lymphs: 20 %
MCH: 33 pg (ref 26.6–33.0)
MCHC: 34.5 g/dL (ref 31.5–35.7)
MCV: 96 fL (ref 79–97)
Monocytes Absolute: 0.5 x10E3/uL (ref 0.1–0.9)
Monocytes: 7 %
Neutrophils Absolute: 4.8 x10E3/uL (ref 1.4–7.0)
Neutrophils: 71 %
Platelets: 220 x10E3/uL (ref 150–450)
RBC: 4.7 x10E6/uL (ref 4.14–5.80)
RDW: 12 % (ref 11.6–15.4)
WBC: 6.7 x10E3/uL (ref 3.4–10.8)

## 2024-07-19 ENCOUNTER — Ambulatory Visit: Payer: Self-pay | Admitting: Medical

## 2024-08-27 ENCOUNTER — Other Ambulatory Visit: Payer: Self-pay

## 2024-08-27 DIAGNOSIS — Z125 Encounter for screening for malignant neoplasm of prostate: Secondary | ICD-10-CM

## 2024-08-27 DIAGNOSIS — I10 Essential (primary) hypertension: Secondary | ICD-10-CM

## 2025-02-18 ENCOUNTER — Other Ambulatory Visit: Payer: Self-pay
